# Patient Record
Sex: Male | Born: 1955 | Race: White | Hispanic: No | Marital: Married | Smoking: Never smoker
Health system: Southern US, Community
[De-identification: ages and names within clinical notes are randomized; demographics above are authoritative.]

## PROBLEM LIST (undated history)

## (undated) ENCOUNTER — Ambulatory Visit: Admission: EM | Payer: Self-pay | Source: Home / Self Care

## (undated) DIAGNOSIS — F419 Anxiety disorder, unspecified: Secondary | ICD-10-CM

## (undated) DIAGNOSIS — C61 Malignant neoplasm of prostate: Secondary | ICD-10-CM

## (undated) DIAGNOSIS — F32A Depression, unspecified: Secondary | ICD-10-CM

## (undated) DIAGNOSIS — M1990 Unspecified osteoarthritis, unspecified site: Secondary | ICD-10-CM

## (undated) DIAGNOSIS — F329 Major depressive disorder, single episode, unspecified: Secondary | ICD-10-CM

## (undated) DIAGNOSIS — C801 Malignant (primary) neoplasm, unspecified: Secondary | ICD-10-CM

## (undated) HISTORY — PX: VARICOSE VEIN SURGERY: SHX832

## (undated) HISTORY — PX: TONSILLECTOMY: SUR1361

## (undated) HISTORY — PX: RETINAL DETACHMENT SURGERY: SHX105

## (undated) HISTORY — PX: EYE SURGERY: SHX253

---

## 2009-01-05 ENCOUNTER — Ambulatory Visit: Payer: Self-pay | Admitting: Gastroenterology

## 2015-02-16 DIAGNOSIS — M19072 Primary osteoarthritis, left ankle and foot: Secondary | ICD-10-CM | POA: Insufficient documentation

## 2015-02-16 DIAGNOSIS — R7302 Impaired glucose tolerance (oral): Secondary | ICD-10-CM | POA: Insufficient documentation

## 2016-02-21 DIAGNOSIS — R972 Elevated prostate specific antigen [PSA]: Secondary | ICD-10-CM | POA: Insufficient documentation

## 2016-03-12 ENCOUNTER — Ambulatory Visit (INDEPENDENT_AMBULATORY_CARE_PROVIDER_SITE_OTHER): Payer: BLUE CROSS/BLUE SHIELD | Admitting: Urology

## 2016-03-12 ENCOUNTER — Encounter: Payer: Self-pay | Admitting: Urology

## 2016-03-12 VITALS — BP 119/68 | HR 55 | Ht 76.0 in | Wt 280.8 lb

## 2016-03-12 DIAGNOSIS — E782 Mixed hyperlipidemia: Secondary | ICD-10-CM | POA: Insufficient documentation

## 2016-03-12 DIAGNOSIS — R972 Elevated prostate specific antigen [PSA]: Secondary | ICD-10-CM | POA: Diagnosis not present

## 2016-03-12 DIAGNOSIS — F419 Anxiety disorder, unspecified: Secondary | ICD-10-CM | POA: Insufficient documentation

## 2016-03-12 DIAGNOSIS — K219 Gastro-esophageal reflux disease without esophagitis: Secondary | ICD-10-CM | POA: Insufficient documentation

## 2016-03-12 DIAGNOSIS — J309 Allergic rhinitis, unspecified: Secondary | ICD-10-CM | POA: Insufficient documentation

## 2016-03-12 DIAGNOSIS — Q100 Congenital ptosis: Secondary | ICD-10-CM | POA: Insufficient documentation

## 2016-03-12 MED ORDER — CIPROFLOXACIN HCL 500 MG PO TABS: 500.0000 mg | ORAL_TABLET | Freq: Two times a day (BID) | ORAL | 0 refills | Status: AC

## 2016-03-12 NOTE — Progress Notes (Signed)
   03/12/2016 11:30 AM   Rick Rosario 06/28/1956 [id number]  Referring provider: Ezequiel Kayser, MD [facility] [place], [place] [address]  No chief complaint on file.   HPI: Rick Rosario is a 60yo seen in consultation for elevated PSA. PSA on referral is 4.98. Per patient he has had his PSA checked previously and the results were normal.  He denies any LUTS. No issues with ED.  No family hx of prostate cancer. No issues with constipation.   PMH: No past medical history on file.  Surgical History: No past surgical history on file.  Home Medications:    Medication List    as of 03/12/2016 11:30 AM   You have not been prescribed any medications.     Allergies: Allergies not on file  Family History: No family history on file.  Social History:  has no tobacco, alcohol, and drug history on file.  ROS:                                        Physical Exam: There were no vitals taken for this visit.  Constitutional:  Alert and oriented, No acute distress. HEENT: [place] AT, moist mucus membranes.  Trachea midline, no masses. Cardiovascular: No clubbing, cyanosis, or edema. Respiratory: Normal respiratory effort, no increased work of breathing. GI: Abdomen is soft, nontender, nondistended, no abdominal masses GU: No CVA tenderness. Circumcised phallus. No masses/lesions on penis/testis/scotum. Prostate 30g right side nodule, firm.  Skin: No rashes, bruises or suspicious lesions. Lymph: No cervical or inguinal adenopathy. Neurologic: Grossly intact, no focal deficits, moving all 4 extremities. Psychiatric: Normal mood and affect.  Laboratory Data: No results found for: WBC, HGB, HCT, MCV, PLT  No results found for: CREATININE  No results found for: PSA  No results found for: TESTOSTERONE  No results found for: HGBA1C  Urinalysis No results found for: COLORURINE, APPEARANCEUR, LABSPEC, PHURINE, GLUCOSEU, HGBUR,  BILIRUBINUR, KETONESUR, PROTEINUR, UROBILINOGEN, NITRITE, LEUKOCYTESUR  Pertinent Imaging: none  Assessment & Plan:    1. Elevated PSA with Prostate nodule -schedule for prostate biopsy -rx for cipro sent to pharmacy  There are no diagnoses linked to this encounter.  No Follow-up on file.  Nicolette Bang, MD  [facility] [address], [place], [place] [address] [phone]

## 2016-04-09 ENCOUNTER — Ambulatory Visit: Payer: BLUE CROSS/BLUE SHIELD | Admitting: Urology

## 2016-04-09 ENCOUNTER — Encounter: Payer: Self-pay | Admitting: Urology

## 2016-04-09 VITALS — BP 136/79 | HR 60 | Ht 76.0 in | Wt 271.9 lb

## 2016-04-09 DIAGNOSIS — R972 Elevated prostate specific antigen [PSA]: Secondary | ICD-10-CM

## 2016-04-09 MED ORDER — CIPROFLOXACIN HCL 500 MG PO TABS: 500.0000 mg | ORAL_TABLET | Freq: Two times a day (BID) | ORAL | 0 refills | Status: AC

## 2016-04-09 MED ORDER — DIAZEPAM 10 MG PO TABS: 10.0000 mg | ORAL_TABLET | Freq: Four times a day (QID) | ORAL | 0 refills | Status: DC | PRN

## 2016-04-09 NOTE — Progress Notes (Signed)
Pt did not tolerate transrectal [place]. I prescribed valium 10mg  to take prior to the procedure and we will reschedule his prostate biopsy

## 2016-04-14 ENCOUNTER — Ambulatory Visit
Admission: EM | Admit: 2016-04-14 | Discharge: 2016-04-14 | Disposition: A | Discharge status: Home / Self Care | Payer: BLUE CROSS/BLUE SHIELD | Attending: Family Medicine | Admitting: Family Medicine

## 2016-04-14 ENCOUNTER — Ambulatory Visit (INDEPENDENT_AMBULATORY_CARE_PROVIDER_SITE_OTHER): Payer: BLUE CROSS/BLUE SHIELD

## 2016-04-14 DIAGNOSIS — S86811A Strain of other muscle(s) and tendon(s) at lower leg level, right leg, initial encounter: Secondary | ICD-10-CM

## 2016-04-14 DIAGNOSIS — S86911A Strain of unspecified muscle(s) and tendon(s) at lower leg level, right leg, initial encounter: Secondary | ICD-10-CM

## 2016-04-14 MED ORDER — HYDROCODONE-ACETAMINOPHEN 5-325 MG PO TABS: 1.0000 | ORAL_TABLET | Freq: Four times a day (QID) | ORAL | 0 refills | Status: DC | PRN

## 2016-04-14 NOTE — ED Provider Notes (Signed)
[facility]    CSN: [id number] Arrival date & time: 04/14/16  1343  First Provider Contact:  None       History   Chief Complaint Chief Complaint  Patient presents with  . Knee Pain    right    HPI Rick Rosario is a 60 y.o. male.   The history is provided by the patient.  Knee Pain  Location:  Knee Time since incident:  1 week Injury: no   Knee location:  R knee Pain details:    Quality:  Aching   Radiates to:  Does not radiate   Severity:  Moderate   Onset quality:  Gradual   Duration:  1 week   Timing:  Constant   Progression:  Worsening Chronicity:  New Dislocation: no   Foreign body present:  No foreign bodies Prior injury to area:  No Relieved by:  NSAIDs (mildly) Worsened by:  Bearing weight Associated symptoms: decreased ROM, stiffness and swelling   Associated symptoms: no back pain, no fatigue, no fever, no itching, no muscle weakness, no neck pain, no numbness and no tingling   Risk factors: obesity   Risk factors: no concern for non-accidental trauma, no frequent fractures, no known bone disorder and no recent illness     History reviewed. No pertinent past medical history.  Patient Active Problem List   Diagnosis Date Noted  . Allergic rhinitis 03/12/2016  . Anxiety 03/12/2016  . 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 GERD (gastroesophageal reflux disease) 03/12/2016  . Mixed hyperlipidemia 03/12/2016  . Elevated PSA 02/21/2016  . Impaired glucose tolerance 02/16/2015  . Primary osteoarthritis of left ankle 02/16/2015    History reviewed. No pertinent surgical history.     Home Medications    Prior to Admission medications   Medication Sig Start Date End Date Taking? Authorizing Provider  aspirin EC 81 MG tablet Take by mouth.    Historical Provider, MD  diazepam (VALIUM) 10 MG tablet Take 1 tablet (10 mg total) by mouth every 6 (six) hours as needed for anxiety. 04/09/16   Cleon Gustin, MD    HYDROcodone-acetaminophen (NORCO/VICODIN) 5-325 MG tablet Take 1-2 tablets by mouth every 6 (six) hours as needed. 04/14/16   Norval Gable, MD  naproxen sodium (ANAPROX) 220 MG tablet Take by mouth.    Historical Provider, MD  PARoxetine (PAXIL) 20 MG tablet TAKE 0.5 TABLETS (10 MG TOTAL) BY MOUTH ONCE DAILY. 02/20/16   Historical Provider, MD    Family History Family History  Problem Relation Age of Onset  . Hematuria Neg Hx   . Renal cancer Neg Hx   . Prostate cancer Neg Hx   . Sickle cell anemia Neg Hx     Social History Social History  Substance Use Topics  . Smoking status: Never Smoker  . Smokeless tobacco: Never Used  . Alcohol use No     Allergies   Penicillin g   Review of Systems Review of Systems  Constitutional: Negative for fatigue and fever.  Musculoskeletal: Positive for stiffness. Negative for back pain and neck pain.  Skin: Negative for itching.     Physical Exam Triage Vital Signs ED Triage Vitals  Enc Vitals Group     BP 04/14/16 1422 137/84     Pulse Rate 04/14/16 1422 60     Resp 04/14/16 1422 16     Temp 04/14/16 1422 97.1 F (36.2 C)     Temp Source 04/14/16 1422 Tympanic  SpO2 04/14/16 1422 98 %     Weight 04/14/16 1421 270 lb (122.5 kg)     Height 04/14/16 1421 6\' 4"  (1.93 m)     Head Circumference --      Peak Flow --      Pain Score 04/14/16 1422 7     Pain Loc --      Pain Edu? --      Excl. in [place]? --    No data found.   Updated Vital Signs BP 137/84 (BP Location: Left Arm)   Pulse 60   Temp 97.1 F (36.2 C) (Tympanic)   Resp 16   Ht 6\' 4"  (1.93 m)   Wt 270 lb (122.5 kg)   SpO2 98%   BMI 32.87 kg/m   Visual Acuity Right Eye Distance:   Left Eye Distance:   Bilateral Distance:    Right Eye Near:   Left Eye Near:    Bilateral Near:     Physical Exam  Constitutional: He appears well-developed and well-nourished. No distress.  Musculoskeletal:       Right knee: He exhibits swelling. He exhibits no effusion,  no ecchymosis, no deformity, no laceration, no erythema, normal alignment, no LCL laxity, normal patellar mobility, no bony tenderness, normal meniscus and no MCL laxity. Tenderness (diffuse antero-lateral knee ) found.  Skin: He is not diaphoretic.  Nursing note and vitals reviewed.    UC Treatments / Results  Labs (all labs ordered are listed, but only abnormal results are displayed) Labs Reviewed - No data to display  EKG  EKG Interpretation None       Radiology Dg Knee Complete 4 Views Right  Result Date: 04/14/2016 CLINICAL DATA:  Right knee pain after the right knee gave in yesterday. EXAM: RIGHT KNEE - COMPLETE 4+ VIEW COMPARISON:  None. FINDINGS: No evidence of fracture, dislocation. There is minimal suprapatellar effusion. There is mild decreased medial femoral tibial joint space. Soft tissues are unremarkable. IMPRESSION: No acute fracture or dislocation. Mild degenerative joint changes of right knee. Electronically Signed   By: Abelardo Diesel M.D.   On: 04/14/2016 15:00    Procedures Procedures (including critical care time)  Medications Ordered in UC Medications - No data to display   Initial Impression / Assessment and Plan / UC Course  I have reviewed the triage vital signs and the nursing notes.  Pertinent labs & imaging results that were available during my care of the patient were reviewed by me and considered in my medical decision making (see chart for details).  Clinical Course      Final Clinical Impressions(s) / UC Diagnoses   Final diagnoses:  Knee strain, right, initial encounter    New Prescriptions Discharge Medication List as of 04/14/2016  3:08 PM    START taking these medications   Details  HYDROcodone-acetaminophen (NORCO/VICODIN) 5-325 MG tablet Take 1-2 tablets by mouth every 6 (six) hours as needed., Starting Sun 04/14/2016, Print        1. x-ray results (negative for acute injury) and diagnosis reviewed with patient 2. rx as per  orders above; reviewed possible side effects, interactions, risks and benefits  3. Recommend supportive treatment with rest, ice, elevation, otc NSAIDS 4. Follow-up prn if symptoms worsen or don't improve   Norval Gable, MD 04/14/16 1558

## 2016-04-14 NOTE — ED Triage Notes (Signed)
Patient states that he was weed eating yesterday and his right knee just gave way on hime. Patient states that it hurts to put pressure on knee. Patient states that knee is aching.

## 2016-04-16 ENCOUNTER — Ambulatory Visit: Payer: BLUE CROSS/BLUE SHIELD

## 2016-05-07 ENCOUNTER — Other Ambulatory Visit: Payer: Self-pay | Admitting: Urology

## 2016-05-07 ENCOUNTER — Ambulatory Visit (INDEPENDENT_AMBULATORY_CARE_PROVIDER_SITE_OTHER): Payer: BLUE CROSS/BLUE SHIELD | Admitting: Urology

## 2016-05-07 ENCOUNTER — Encounter: Payer: Self-pay | Admitting: Urology

## 2016-05-07 VITALS — BP 136/83 | HR 69 | Ht 76.0 in | Wt 274.9 lb

## 2016-05-07 DIAGNOSIS — R972 Elevated prostate specific antigen [PSA]: Secondary | ICD-10-CM | POA: Diagnosis not present

## 2016-05-07 MED ORDER — GENTAMICIN SULFATE 40 MG/ML IJ SOLN
80.0000 mg | Freq: Once | INTRAMUSCULAR | Status: AC
  Administered 2016-05-07: 80 mg via INTRAMUSCULAR

## 2016-05-07 NOTE — Progress Notes (Signed)
05/07/2016 8:45 AM   Louann Liv [birth year] [id number]  Referring provider: Ezequiel Kayser, MD [facility] [place], [place] [address]  No chief complaint on file.   HPI:  1 - Elevated PSA - PSA 4.98 2017 by PCP. Had been "normal" prior. Had first attempt TRUS bx few weeks ago but did not tollerate ultarsound probe. Re-attempt 9/26 with diazepam successful, TRUS 24 mL without median lobe.   Today "Rick Rosario" is seen for re-attempt prostate biopsy. He took diazepam and ABX prior.      PMH: No past medical history on file.  Surgical History: No past surgical history on file.  Home Medications:    Medication List       Accurate as of 05/07/16  8:45 AM. Always use your most recent med list.          aspirin EC 81 MG tablet Take by mouth.   diazepam 10 MG tablet Commonly known as:  VALIUM Take 1 tablet (10 mg total) by mouth every 6 (six) hours as needed for anxiety.   HYDROcodone-acetaminophen 5-325 MG tablet Commonly known as:  NORCO/VICODIN Take 1-2 tablets by mouth every 6 (six) hours as needed.   naproxen sodium 220 MG tablet Commonly known as:  ANAPROX Take by mouth.   PARoxetine 20 MG tablet Commonly known as:  PAXIL TAKE 0.5 TABLETS (10 MG TOTAL) BY MOUTH ONCE DAILY.       Allergies:  Allergies  Allergen Reactions  . Penicillin G     Other reaction(s): Unknown    Family History: Family History  Problem Relation Age of Onset  . Hematuria Neg Hx   . Renal cancer Neg Hx   . Prostate cancer Neg Hx   . Sickle cell anemia Neg Hx     Social History:  reports that he has never smoked. He has never used smokeless tobacco. He reports that he does not drink alcohol or use drugs.   Review of Systems  Gastrointestinal (upper)  : Negative for upper GI symptoms  Gastrointestinal (lower) : Negative for lower GI symptoms  Constitutional : Negative for symptoms  Skin: Negative for skin symptoms  Eyes: Negative for  eye symptoms  Ear/Nose/Throat : Negative for Ear/Nose/Throat symptoms  Hematologic/Lymphatic: Negative for Hematologic/Lymphatic symptoms  Cardiovascular : Negative for cardiovascular symptoms  Respiratory : Negative for respiratory symptoms  Endocrine: Negative for endocrine symptoms  Musculoskeletal: Negative for musculoskeletal symptoms  Neurological: Negative for neurological symptoms  Psychologic: Negative for psychiatric symptoms    Physical Exam: There were no vitals taken for this visit.  Constitutional:  Alert and oriented, No acute distress. HEENT: [place] AT, moist mucus membranes.  Trachea midline, no masses. Cardiovascular: No clubbing, cyanosis, or edema. Respiratory: Normal respiratory effort, no increased work of breathing. GI: Abdomen is soft, nontender, nondistended, no abdominal masses GU: No CVA tenderness.  Skin: No rashes, bruises or suspicious lesions. Lymph: No cervical or inguinal adenopathy. Neurologic: Grossly intact, no focal deficits, moving all 4 extremities. Psychiatric: Normal mood and affect.   Prostate Biopsy Procedure   Informed consent was obtained after discussing risks/benefits of the procedure.  A time out was performed to ensure correct patient identity.  Pre-Procedure: - Last PSA Level: No results found for: PSA - Gentamicin given prophylactically - Levaquin 500 mg administered PO -Transrectal Ultrasound performed revealing a 24 gm prostate. Some hypoechoic areas left side. -No significant hypoechoic or median lobe noted  Procedure: - Prostate block performed using 10 cc 1% lidocaine  and biopsies taken from sextant areas, a total of 12 under ultrasound guidance.  Post-Procedure: - Patient tolerated the procedure well - He was counseled to seek immediate medical attention if experiences any severe pain, significant bleeding, or fevers - Return in one week to discuss biopsy results   Assessment & Plan:    1. Elevated PSA  - s/p biopsy as per above. Warned to contact MD for fever >101 or new urian ry retention.   2 - RTc 3 weeks for results discussion.    No Follow-up on file.  Alexis Frock, [facility] [address], [place], [place] [address] [phone]

## 2016-05-07 NOTE — Addendum Note (Signed)
Addended by: Wilson Singer on: 05/07/2016 03:25 PM   Modules accepted: Orders

## 2016-05-10 ENCOUNTER — Other Ambulatory Visit: Payer: Self-pay | Admitting: Urology

## 2016-05-10 LAB — PATHOLOGY REPORT

## 2016-05-14 ENCOUNTER — Ambulatory Visit (INDEPENDENT_AMBULATORY_CARE_PROVIDER_SITE_OTHER): Payer: BLUE CROSS/BLUE SHIELD | Admitting: Urology

## 2016-05-14 VITALS — BP 142/79 | HR 61 | Ht 76.0 in | Wt 270.0 lb

## 2016-05-14 DIAGNOSIS — C61 Malignant neoplasm of prostate: Secondary | ICD-10-CM

## 2016-05-14 NOTE — Progress Notes (Signed)
05/14/2016 4:56 PM   Louann Liv 03/27/1956 [id number]  Referring provider: Ezequiel Kayser, MD [facility] [place], [place] [address]  Chief Complaint  Patient presents with  . Prostate Cancer    biopsy results    HPI: Mr Rick Rosario is a 60yo with a hx of elevated PSA here for pathology discussion after prostate biopsy. Prostate biopsy showed Gleason 3+4=7 in 2/12 cores and Gleason 3+3=6 in 1/12 cores.     PMH: No past medical history on file.  Surgical History: No past surgical history on file.  Home Medications:    Medication List       Accurate as of 05/14/16  4:56 PM. Always use your most recent med list.          aspirin EC 81 MG tablet Take by mouth.   naproxen sodium 220 MG tablet Commonly known as:  ANAPROX Take by mouth.   PARoxetine 20 MG tablet Commonly known as:  PAXIL Take 20 mg by mouth daily.       Allergies:  Allergies  Allergen Reactions  . Penicillin G     Other reaction(s): Unknown    Family History: Family History  Problem Relation Age of Onset  . Hematuria Neg Hx   . Renal cancer Neg Hx   . Prostate cancer Neg Hx   . Sickle cell anemia Neg Hx     Social History:  reports that he has never smoked. He has never used smokeless tobacco. He reports that he does not drink alcohol or use drugs.  ROS: UROLOGY Frequent Urination?: No Hard to postpone urination?: No Burning/pain with urination?: No Get up at night to urinate?: No Leakage of urine?: No Urine stream starts and stops?: No Trouble starting stream?: No Do you have to strain to urinate?: No Blood in urine?: No Urinary tract infection?: No Sexually transmitted disease?: No Injury to kidneys or bladder?: No Painful intercourse?: No Weak stream?: No Erection problems?: No Penile pain?: No  Gastrointestinal Nausea?: No Vomiting?: No Indigestion/heartburn?: No Diarrhea?: No Constipation?: No  Constitutional Fever: No Night  sweats?: No Weight loss?: No Fatigue?: No  Skin Skin rash/lesions?: No Itching?: No  Eyes Blurred vision?: No Double vision?: No  Ears/Nose/Throat Sore throat?: No Sinus problems?: No  Hematologic/Lymphatic Swollen glands?: No Easy bruising?: No  Cardiovascular Leg swelling?: No Chest pain?: No  Respiratory Cough?: No Shortness of breath?: No  Endocrine Excessive thirst?: No  Musculoskeletal Back pain?: No Joint pain?: No  Neurological Headaches?: No Dizziness?: No  Psychologic Depression?: No Anxiety?: No  Physical Exam: BP (!) 142/79   Pulse 61   Ht 6\' 4"  (1.93 m)   Wt 122.5 kg (270 lb)   BMI 32.87 kg/m   Constitutional:  Alert and oriented, No acute distress. HEENT: [place] AT, moist mucus membranes.  Trachea midline, no masses. Cardiovascular: No clubbing, cyanosis, or edema. Respiratory: Normal respiratory effort, no increased work of breathing. GI: Abdomen is soft, nontender, nondistended, no abdominal masses GU: No CVA tenderness.  Skin: No rashes, bruises or suspicious lesions. Lymph: No cervical or inguinal adenopathy. Neurologic: Grossly intact, no focal deficits, moving all 4 extremities. Psychiatric: Normal mood and affect.  Laboratory Data: No results found for: WBC, HGB, HCT, MCV, PLT  No results found for: CREATININE  No results found for: PSA  No results found for: TESTOSTERONE  No results found for: HGBA1C  Urinalysis No results found for: COLORURINE, APPEARANCEUR, LABSPEC, PHURINE, GLUCOSEU, HGBUR, BILIRUBINUR, KETONESUR, PROTEINUR, UROBILINOGEN, NITRITE,  LEUKOCYTESUR  Pertinent Imaging: none  Assessment & Plan:   1. Prostate cancer -We discussed the natural hx of intermediate risk prostate cancer and the various treatment options including AS, IMRT, brachy, RRP, cryoablation.  Pt wants to discuss radiation therapy and will refer to Rad Onc. RTC after Rad Onc appointment to further discuss treatment   There are no  diagnoses linked to this encounter.  No Follow-up on file.  Nicolette Bang, MD  [facility] [address] [place], [place] [address] [phone]

## 2016-05-23 ENCOUNTER — Encounter: Payer: Self-pay | Admitting: Radiation Oncology

## 2016-05-23 ENCOUNTER — Ambulatory Visit
Admission: RE | Admit: 2016-05-23 | Discharge: 2016-05-23 | Disposition: A | Discharge status: Home / Self Care | Payer: BLUE CROSS/BLUE SHIELD | Source: Ambulatory Visit | Attending: Radiation Oncology | Admitting: Radiation Oncology

## 2016-05-23 VITALS — BP 144/85 | HR 54 | Temp 97.5°F | Resp 18 | Wt 286.9 lb

## 2016-05-23 DIAGNOSIS — Z7982 Long term (current) use of aspirin: Secondary | ICD-10-CM | POA: Diagnosis not present

## 2016-05-23 DIAGNOSIS — Z79899 Other long term (current) drug therapy: Secondary | ICD-10-CM | POA: Diagnosis not present

## 2016-05-23 DIAGNOSIS — C61 Malignant neoplasm of prostate: Secondary | ICD-10-CM

## 2016-05-23 NOTE — Consult Note (Signed)
NEW PATIENT EVALUATION  Name: Rick Rosario  MRN: [id number]  Date:   05/23/2016     DOB: [birth year]   This 60 y.o. 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  REFERRING PHYSICIAN: Ezequiel Kayser, MD  CHIEF COMPLAINT:  Chief Complaint  Patient presents with  . Prostate Cancer    Pt is here for initial consultation of prostate cancer.     DIAGNOSIS: The encounter diagnosis was Malignant neoplasm of prostate ([place]).   PREVIOUS INVESTIGATIONS:  Pathology reports reviewed Clinical notes reviewed [facility] nomogram reviewed  HPI: Patient is a 60 year old male who presented with a slightly elevated PSA in the 5 range performed by his PMD.He was seen by urology and on a second attempt underwent transrectal ultrasound-guided biopsy showing a 24 cc prostate with 3 of 12 biopsy specimens positive for adenocarcinoma mostly Gleason 7 (3+4). He is asymptomatic specifically denies any lower urinary tract symptoms nocturia urgency frequency. He's had no prior surgical history his overall in general excellent condition. Treatment options were reviewed with patient by urology he is now seen for radiation oncology opinion. Patient is leaning towards I-125 interstitial implant.  PLANNED TREATMENT REGIMEN: I-125 interstitial implant  PAST MEDICAL HISTORY:  has no past medical history on file.    PAST SURGICAL HISTORY: History reviewed. No pertinent surgical history.  FAMILY HISTORY: family history is not on file.  SOCIAL HISTORY:  reports that he has never smoked. He has never used smokeless tobacco. He reports that he does not drink alcohol or use drugs.  ALLERGIES: Penicillin g  MEDICATIONS:  Current Outpatient Prescriptions  Medication Sig Dispense Refill  . Garlic 123XX123 MG CAPS Take 1 capsule by mouth.    [name] GLUCOSAMINE HCL-MSM PO Take 2 tablets by mouth daily.    . Multiple  Vitamin (MULTIVITAMIN WITH MINERALS) TABS tablet Take 1 tablet by mouth daily.    [name] aspirin EC 81 MG tablet Take by mouth.    . naproxen sodium (ANAPROX) 220 MG tablet Take by mouth.    [name] PARoxetine (PAXIL) 20 MG tablet Take 20 mg by mouth daily.     No current facility-administered medications for this encounter.     ECOG PERFORMANCE STATUS:  0 - Asymptomatic  REVIEW OF SYSTEMS:  Patient denies any weight loss, fatigue, weakness, fever, chills or night sweats. Patient denies any loss of vision, blurred vision. Patient denies any ringing  of the ears or hearing loss. No irregular heartbeat. Patient denies heart murmur or history of fainting. Patient denies any chest pain or pain radiating to her upper extremities. Patient denies any shortness of breath, difficulty breathing at night, cough or hemoptysis. Patient denies any swelling in the lower legs. Patient denies any nausea vomiting, vomiting of blood, or coffee ground material in the vomitus. Patient denies any stomach pain. Patient states has had normal bowel movements no significant constipation or diarrhea. Patient denies any dysuria, hematuria or significant nocturia. Patient denies any problems walking, swelling in the joints or loss of balance. Patient denies any skin changes, loss of hair or loss of weight. Patient denies any excessive worrying or anxiety or significant depression. Patient denies any problems with insomnia. Patient denies excessive thirst, polyuria, polydipsia. Patient denies any swollen glands, patient denies easy bruising or easy bleeding. Patient denies any recent infections, allergies or URI. Patient "s visual fields have not changed significantly in recent time.  PHYSICAL EXAM: BP (!) 144/85   Pulse (!) 54   Temp 97.5 F (36.4 C)   Resp 18   Wt 286 lb 14.9 oz (130.2 kg)   BMI 34.93 kg/m  On rectal exam rectal sphincter tone is good. Prostate is smooth contracted without evidence of nodularity or mass. Sulcus  is preserved bilaterally. No discrete nodularity is identified. No other rectal abnormalities are noted. Well-developed well-nourished patient in NAD. HEENT reveals PERLA, EOMI, discs not visualized.  Oral cavity is clear. No oral mucosal lesions are identified. Neck is clear without evidence of cervical or supraclavicular adenopathy. Lungs are clear to A&P. Cardiac examination is essentially unremarkable with regular rate and rhythm without murmur rub or thrill. Abdomen is benign with no organomegaly or masses noted. Motor sensory and DTR levels are equal and symmetric in the upper and lower extremities. Cranial nerves II through XII are grossly intact. Proprioception is intact. No peripheral adenopathy or edema is identified. No motor or sensory levels are noted. Crude visual fields are within normal range.  LABORATORY DATA: Pathology reports reviewed    RADIOLOGY RESULTS: Bone scan not ordered based on low PSA which is the current recommended standard   IMPRESSION: Stage IIa adenocarcinoma the prostate in 60 year old male  PLAN: At this time I got over treatment options including watchful waiting robotic prostatectomy as well as external and I-125 interstitial implant. Risks and benefits of all procedures again were discussed in detail with the patient and his wife. Patient is leaning towards I-125 interstitial implant. I believe he would be an excellent candidate for that. I've explained to him the dual role of both for volume study as well as the implant itself. Risks and benefits of treatment including radiation safety precautions during his initial implant, fatigue, alteration of blood counts, increasing lower urinary tract symptoms and possible diarrhea and fatigue all were discussed in detail with the patient. We will arrange for his 5 studies as well as his implant over the next several days.  I would like to take this opportunity to thank you for allowing me to participate in the care of your  patient.Armstead Peaks., MD

## 2016-05-29 ENCOUNTER — Telehealth: Payer: Self-pay | Admitting: Radiology

## 2016-05-29 NOTE — Telephone Encounter (Signed)
Pt has chosen to proceed with brachytherapy. His volume study is scheduled for 06/11/16 & seed implant on 07/09/16. He is scheduled to see Dr Alyson Ingles on 06/21/16. He asks if he should keep this appt. Please advise.

## 2016-05-31 ENCOUNTER — Other Ambulatory Visit: Payer: Self-pay | Admitting: Radiology

## 2016-05-31 ENCOUNTER — Telehealth: Payer: Self-pay | Admitting: Radiology

## 2016-05-31 DIAGNOSIS — C61 Malignant neoplasm of prostate: Secondary | ICD-10-CM

## 2016-05-31 NOTE — Telephone Encounter (Signed)
Notified pt of prostate volume study scheduled 06/11/16. Pt states he has been notified by Dr Olena Leatherwood office of instructions for this procedure. Notified pt of brachytherapy scheduled 07/09/16, pre-admit testing appt on 06/25/16 @8 :15 & to call day prior to procedure for arrival time to SDS. Advised pt to hold ASA 81mg  7 days prior to procedure, clear liquid diet 48 hrs prior, npo after mn & fleets enema 2 hrs prior to procedure. Pt voices understanding. These instructions were also mailed to pt's home address.

## 2016-05-31 NOTE — Telephone Encounter (Signed)
Appt cancelled. Pt aware.

## 2016-06-11 ENCOUNTER — Ambulatory Visit
Admission: RE | Admit: 2016-06-11 | Discharge: 2016-06-11 | Disposition: A | Discharge status: Home / Self Care | Payer: BLUE CROSS/BLUE SHIELD | Source: Ambulatory Visit | Attending: Radiation Oncology | Admitting: Radiation Oncology

## 2016-06-11 ENCOUNTER — Encounter: Payer: Self-pay | Admitting: Radiation Oncology

## 2016-06-11 VITALS — BP 146/87 | HR 59 | Temp 98.4°F | Wt 284.4 lb

## 2016-06-11 DIAGNOSIS — C61 Malignant neoplasm of prostate: Secondary | ICD-10-CM

## 2016-06-11 HISTORY — PX: VOLUME STUDY: SHX6646

## 2016-06-11 SURGERY — ULTRASOUND, PROSTATE, FOR VOLUME DETERMINATION
Anesthesia: Choice

## 2016-06-11 NOTE — H&P (Signed)
NEW PATIENT EVALUATION  Name: Rick Rosario  MRN: [id number]  Date:   06/11/2016     DOB: 1955/10/08   This 60 y.o. male patient presents to the clinic for preop history and physical for I-125 interstitial implant   REFERRING PHYSICIAN: Ezequiel Kayser, MD  CHIEF COMPLAINT:  Chief Complaint  Patient presents with  . Prostate Cancer    Follow up post Volume Study    DIAGNOSIS: The encounter diagnosis was Malignant neoplasm of prostate ([place]).   PREVIOUS INVESTIGATIONS:  Pathology report reviewed Clinical notes reviewed Meds and allergies reviewed  HPI: Patient is a 60 year old male who presented with slight increase in his PSA to 5. He was seen by urology underwent transrectal ultrasound-guided biopsy showing 3 of 12 core biopsies positive for adenocarcinoma mostly Gleason 7 (3+4). Patient was asymptomatic no specific lower urinary tract symptoms or abdominal complaints. Patient opted after radiation oncology consultation for I-125 interstitial implant. He is seen today for his preop history and physical as well as volume study.  PLANNED TREATMENT REGIMEN: I-125 interstitial implant  PAST MEDICAL HISTORY:  has no past medical history on file.    PAST SURGICAL HISTORY: History reviewed. No pertinent surgical history.  FAMILY HISTORY: family history is not on file.  SOCIAL HISTORY:  reports that he has never smoked. He has never used smokeless tobacco. He reports that he does not drink alcohol or use drugs.  ALLERGIES: Penicillin g  MEDICATIONS:  Current Outpatient Prescriptions  Medication Sig Dispense Refill  . aspirin EC 81 MG tablet Take 81 mg by mouth daily.     . Garlic 123XX123 MG CAPS Take 1,000 mg by mouth daily.     [name] GLUCOSAMINE HCL-MSM PO Take 2 tablets by mouth daily.    . Multiple Vitamin (MULTIVITAMIN WITH MINERALS) TABS tablet Take 1 tablet by mouth daily.    . naproxen sodium (ANAPROX) 220 MG tablet Take 220 mg by mouth daily as needed (for pain/headache.).      [name] PARoxetine (PAXIL) 20 MG tablet Take 10 mg by mouth daily.     [name] tetrahydrozoline-zinc (VISINE-AC) 0.05-0.25 % ophthalmic solution 1-2 drops 3 (three) times daily as needed (for allergy eyes).     No current facility-administered medications for this encounter.     ECOG PERFORMANCE STATUS:  0 - Asymptomatic  REVIEW OF SYSTEMS:  Patient denies any weight loss, fatigue, weakness, fever, chills or night sweats. Patient denies any loss of vision, blurred vision. Patient denies any ringing  of the ears or hearing loss. No irregular heartbeat. Patient denies heart murmur or history of fainting. Patient denies any chest pain or pain radiating to her upper extremities. Patient denies any shortness of breath, difficulty breathing at night, cough or hemoptysis. Patient denies any swelling in the lower legs. Patient denies any nausea vomiting, vomiting of blood, or coffee ground material in the vomitus. Patient denies any stomach pain. Patient states has had normal bowel movements no significant constipation or diarrhea. Patient denies any dysuria, hematuria or significant nocturia. Patient denies any problems walking, swelling in the joints or loss of balance. Patient denies any skin changes, loss of hair or loss of weight. Patient denies any excessive worrying or anxiety or significant depression. Patient denies any problems with insomnia. Patient denies excessive thirst, polyuria, polydipsia. Patient denies any swollen glands, patient denies easy bruising or easy bleeding. Patient denies any recent infections, allergies or URI. Patient "s visual fields have not changed significantly in recent time.    PHYSICAL EXAM:  BP (!) 146/87   Pulse (!) 59   Temp 98.4 F (36.9 C)   Wt 284 lb 6.3 oz (129 kg)   BMI 34.62 kg/m  Well-developed well-nourished patient in NAD. HEENT reveals PERLA, EOMI, discs not visualized.  Oral cavity is clear. No oral mucosal lesions are identified. Neck is clear without evidence  of cervical or supraclavicular adenopathy. Lungs are clear to A&P. Cardiac examination is essentially unremarkable with regular rate and rhythm without murmur rub or thrill. Abdomen is benign with no organomegaly or masses noted. Motor sensory and DTR levels are equal and symmetric in the upper and lower extremities. Cranial nerves II through XII are grossly intact. Proprioception is intact. No peripheral adenopathy or edema is identified. No motor or sensory levels are noted. Crude visual fields are within normal range.  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  PLAN: At this time I overall risks and benefits of treatment. I've also gone over radiation safety precautions with the patient. Increased in lower urinary tract symptoms during the first several months of implant fatigue possible diarrhea possible slight alteration of blood counts all were discussed in detail with the patient and his family. They all seem to comprehend my treatment plan well. Patient had volume study today.  I would like to take this opportunity to thank you for allowing me to participate in the care of your patient.Armstead Peaks., MD

## 2016-06-11 NOTE — Progress Notes (Signed)
Radiation Oncology Follow up Note  Name: Rick Rosario   Date:   06/11/2016 MRN:  [id number] DOB: 12-15-1955    This 60 y.o. male presents to the clinic today for volume study for I-125 interstitial implant.  REFERRING PROVIDER: Ezequiel Kayser, MD  HPI: Patient is a 60 year old male presented with slight increase in his PSA to 5 was seen by urology and underwent transrectal ultrasound-guided biopsy showing 24 6 cc prostate with 3 of 12 biopsy specimens positive for adenocarcinoma mostly Gleason 7 (3+4). He is asymptomatic specifically denies any lower urinary tract symptoms nocturia urgency or frequency. Overall excellent general condition he opted for I-125 and interstitial implant is seen today for volume study and preop history and physical..  COMPLICATIONS OF TREATMENT: none  FOLLOW UP COMPLIANCE: keeps appointments   PHYSICAL EXAM:  BP (!) 146/87   Pulse (!) 59   Temp 98.4 F (36.9 C)   Wt 284 lb 6.3 oz (129 kg)   BMI 34.62 kg/m  Well-developed well-nourished patient in NAD. HEENT reveals PERLA, EOMI, discs not visualized.  Oral cavity is clear. No oral mucosal lesions are identified. Neck is clear without evidence of cervical or supraclavicular adenopathy. Lungs are clear to A&P. Cardiac examination is essentially unremarkable with regular rate and rhythm without murmur rub or thrill. Abdomen is benign with no organomegaly or masses noted. Motor sensory and DTR levels are equal and symmetric in the upper and lower extremities. Cranial nerves II through XII are grossly intact. Proprioception is intact. No peripheral adenopathy or edema is identified. No motor or sensory levels are noted. Crude visual fields are within normal range.  RADIOLOGY RESULTS: No current films for review  PLAN: Patient was taken to the cystoscopy suite in the OR. Patient was placed in the low lithotomy position. Foley catheter was placed. Trans-rectal ultrasound probe was inserted into the rectum and  prostate seminal vesicles were visualized as well as bladder base. stepping images were performed on a 5 mm increments. Images will be placed in Brachy[facility]ision treatment planning system to determine seed placement coordinates for eventual I-125 interstitial implant. Images will be reviewed with the physics and dosimetry staff for final quality approval. I personally was present for the volume study and assisted in delineation of contour volumes.  At the end of the procedure Foley catheter was removed, rectal ultrasound probe was removed. Patient tolerated his procedures extremely well with no side effects or complaints. Patient has given appointment for interstitial implant date. Consent was signed today as well as history and physical performed in preparation for his outpatient surgical implant.      Armstead Peaks., MD

## 2016-06-12 ENCOUNTER — Ambulatory Visit: Admission: RE | Admit: 2016-06-12 | Payer: BLUE CROSS/BLUE SHIELD | Source: Ambulatory Visit | Admitting: Urology

## 2016-06-13 ENCOUNTER — Ambulatory Visit: Payer: BLUE CROSS/BLUE SHIELD

## 2016-06-13 DIAGNOSIS — C61 Malignant neoplasm of prostate: Secondary | ICD-10-CM | POA: Diagnosis not present

## 2016-06-21 ENCOUNTER — Ambulatory Visit: Payer: BLUE CROSS/BLUE SHIELD

## 2016-06-25 ENCOUNTER — Encounter
Admission: RE | Admit: 2016-06-25 | Discharge: 2016-06-25 | Disposition: A | Discharge status: Home / Self Care | Payer: BLUE CROSS/BLUE SHIELD | Source: Ambulatory Visit | Attending: Urology | Admitting: Urology

## 2016-06-25 DIAGNOSIS — Z01818 Encounter for other preprocedural examination: Secondary | ICD-10-CM | POA: Insufficient documentation

## 2016-06-25 HISTORY — DX: Anxiety disorder, unspecified: F41.9

## 2016-06-25 HISTORY — DX: Major depressive disorder, single episode, unspecified: F32.9

## 2016-06-25 HISTORY — DX: Unspecified osteoarthritis, unspecified site: M19.90

## 2016-06-25 HISTORY — DX: Malignant (primary) neoplasm, unspecified: C80.1

## 2016-06-25 HISTORY — DX: Depression, unspecified: F32.A

## 2016-06-25 LAB — CBC
HEMATOCRIT: 43.9 % (ref 40.0–52.0)
HEMOGLOBIN: 15.1 g/dL (ref 13.0–18.0)
MCH: 31.5 pg (ref 26.0–34.0)
MCHC: 34.4 g/dL (ref 32.0–36.0)
MCV: 91.6 fL (ref 80.0–100.0)
PLATELETS: 221 10*3/uL (ref 150–440)
RBC: 4.79 MIL/uL (ref 4.40–5.90)
RDW: 13.5 % (ref 11.5–14.5)
WBC: 7.1 10*3/uL (ref 3.8–10.6)

## 2016-06-25 NOTE — Patient Instructions (Signed)
  Your procedure is scheduled [id number] 28, 2017 (Tuesday) Report to [facility]nd [facility] To find out your arrival time please call [phone] between 1PM - 3PM on July 08, 2016 (Monday)  Remember: Instructions that are not followed completely may result in serious medical risk, up to and including death, or upon the discretion of your surgeon and anesthesiologist your surgery may need to be rescheduled.    _x___ 1. Do not eat food or drink liquids after midnight. No gum chewing or hard candies.(CLEAR LIQUID DIET 48 HOURS PRIOR TO SURGERY)    __x__ 2. No Alcohol for 24 hours before or after surgery.   __x__3. No Smoking for 24 prior to surgery.   ____  4. Bring all medications with you on the day of surgery if instructed.    __x__ 5. Notify your doctor if there is any change in your medical condition     (cold, fever, infections).     Do not wear jewelry, make-up, hairpins, clips or nail polish.  Do not wear lotions, powders, or perfumes. You may wear deodorant.  Do not shave 48 hours prior to surgery. Men may shave face and neck.  Do not bring valuables to the hospital.    [facility] is not responsible for any belongings or valuables.               Contacts, dentures or bridgework may not be worn into surgery.  Leave your suitcase in the car. After surgery it may be brought to your room.  For patients admitted to the hospital, discharge time is determined by your treatment team.   Patients discharged the day of surgery will not be allowed to drive home.    Please read over the following fact sheets that you were given:   [facility] Preparing for Surgery and or MRSA Information   _x___ Take these medicines the morning of surgery with A SIP OF WATER:    1.   2.  3.  4.  5.  6.  _x___Fleets enema or Magnesium Citrate as directed.(Fleets enema early morning of surgery)   __ Use CHG Soap or sage wipes as directed on instruction sheet   ____ Use  inhalers on the day of surgery and bring to hospital day of surgery  ____ Stop metformin 2 days prior to surgery    ____ Take 1/2 of usual insulin dose the night before surgery and none on the morning of surgery           _x__ Stop aspirin or coumadin, or plavix (Stop Aspirin one week prior to surgery)  x__ Stop Anti-inflammatories such as Advil, Aleve, Ibuprofen, Motrin, Naproxen,          Naprosyn, Goodies powders or aspirin products. (Stop Aleve one week prior to surgery)       Ok to take Tylenol.   _x___ Stop supplements until after surgery.  (Stop Garlic and Glucosamine now)  ____ Bring C-Pap to the hospital.

## 2016-07-08 ENCOUNTER — Ambulatory Visit: Payer: BLUE CROSS/BLUE SHIELD

## 2016-07-08 MED ORDER — CIPROFLOXACIN IN D5W 400 MG/200ML IV SOLN
400.0000 mg | INTRAVENOUS | Status: AC
  Administered 2016-07-09: 400 mg via INTRAVENOUS

## 2016-07-09 ENCOUNTER — Other Ambulatory Visit: Payer: Self-pay | Admitting: Radiation Oncology

## 2016-07-09 ENCOUNTER — Ambulatory Visit
Admission: RE | Admit: 2016-07-09 | Discharge: 2016-07-09 | Disposition: A | Discharge status: Home / Self Care | Payer: BLUE CROSS/BLUE SHIELD | Source: Ambulatory Visit | Attending: Urology | Admitting: Urology

## 2016-07-09 ENCOUNTER — Encounter
Admission: RE | Disposition: A | Discharge status: Home / Self Care | Payer: Self-pay | Source: Ambulatory Visit | Attending: Urology

## 2016-07-09 ENCOUNTER — Ambulatory Visit: Payer: BLUE CROSS/BLUE SHIELD | Admitting: Anesthesiology

## 2016-07-09 ENCOUNTER — Encounter: Payer: Self-pay | Admitting: *Deleted

## 2016-07-09 ENCOUNTER — Ambulatory Visit
Admission: RE | Admit: 2016-07-09 | Discharge: 2016-07-09 | Disposition: A | Discharge status: Home / Self Care | Payer: BLUE CROSS/BLUE SHIELD | Source: Ambulatory Visit | Attending: Radiation Oncology | Admitting: Radiation Oncology

## 2016-07-09 ENCOUNTER — Ambulatory Visit: Payer: BLUE CROSS/BLUE SHIELD

## 2016-07-09 DIAGNOSIS — Z6833 Body mass index (BMI) 33.0-33.9, adult: Secondary | ICD-10-CM | POA: Diagnosis not present

## 2016-07-09 DIAGNOSIS — E669 Obesity, unspecified: Secondary | ICD-10-CM | POA: Diagnosis not present

## 2016-07-09 DIAGNOSIS — Z7982 Long term (current) use of aspirin: Secondary | ICD-10-CM | POA: Insufficient documentation

## 2016-07-09 DIAGNOSIS — Z79899 Other long term (current) drug therapy: Secondary | ICD-10-CM | POA: Insufficient documentation

## 2016-07-09 DIAGNOSIS — C61 Malignant neoplasm of prostate: Secondary | ICD-10-CM | POA: Diagnosis not present

## 2016-07-09 DIAGNOSIS — Z923 Personal history of irradiation: Secondary | ICD-10-CM

## 2016-07-09 HISTORY — PX: RADIOACTIVE SEED IMPLANT: SHX5150

## 2016-07-09 SURGERY — INSERTION, RADIATION SOURCE, PROSTATE
Anesthesia: General | Wound class: Clean Contaminated

## 2016-07-09 MED ORDER — PROPOFOL 10 MG/ML IV BOLUS
INTRAVENOUS | Status: DC | PRN
  Administered 2016-07-09: 40 mg via INTRAVENOUS

## 2016-07-09 MED ORDER — FAMOTIDINE 20 MG PO TABS
20.0000 mg | ORAL_TABLET | Freq: Once | ORAL | Status: AC
  Administered 2016-07-09: 20 mg via ORAL

## 2016-07-09 MED ORDER — MEPERIDINE HCL 25 MG/ML IJ SOLN: 6.2500 mg | INTRAMUSCULAR | Status: DC | PRN

## 2016-07-09 MED ORDER — DEXAMETHASONE SODIUM PHOSPHATE 10 MG/ML IJ SOLN
INTRAMUSCULAR | Status: DC | PRN
  Administered 2016-07-09: 4 mg via INTRAVENOUS

## 2016-07-09 MED ORDER — PROPOFOL 500 MG/50ML IV EMUL
INTRAVENOUS | Status: DC | PRN
  Administered 2016-07-09: 125 ug/kg/min via INTRAVENOUS

## 2016-07-09 MED ORDER — FENTANYL CITRATE (PF) 100 MCG/2ML IJ SOLN: 25.0000 ug | INTRAMUSCULAR | Status: DC | PRN

## 2016-07-09 MED ORDER — FENTANYL CITRATE (PF) 100 MCG/2ML IJ SOLN
INTRAMUSCULAR | Status: DC | PRN
  Administered 2016-07-09: 100 ug via INTRAVENOUS
  Administered 2016-07-09 (×2): 25 ug via INTRAVENOUS

## 2016-07-09 MED ORDER — BACITRACIN ZINC 500 UNIT/GM EX OINT
TOPICAL_OINTMENT | CUTANEOUS | Status: AC
  Filled 2016-07-09: qty 28.35

## 2016-07-09 MED ORDER — ONDANSETRON HCL 4 MG/2ML IJ SOLN
INTRAMUSCULAR | Status: DC | PRN
  Administered 2016-07-09: 4 mg via INTRAVENOUS

## 2016-07-09 MED ORDER — PROMETHAZINE HCL 25 MG/ML IJ SOLN: 6.2500 mg | INTRAMUSCULAR | Status: DC | PRN

## 2016-07-09 MED ORDER — FLEET ENEMA 7-19 GM/118ML RE ENEM
1.0000 | ENEMA | Freq: Once | RECTAL | Status: AC
  Administered 2016-07-09: 1 via RECTAL

## 2016-07-09 MED ORDER — OXYCODONE HCL 5 MG/5ML PO SOLN: 5.0000 mg | Freq: Once | ORAL | Status: DC | PRN

## 2016-07-09 MED ORDER — LACTATED RINGERS IV SOLN
INTRAVENOUS | Status: DC
  Administered 2016-07-09: 07:00:00 via INTRAVENOUS

## 2016-07-09 MED ORDER — FAMOTIDINE 20 MG PO TABS
ORAL_TABLET | ORAL | Status: AC
  Administered 2016-07-09: 20 mg via ORAL
  Filled 2016-07-09: qty 1

## 2016-07-09 MED ORDER — TAMSULOSIN HCL 0.4 MG PO CAPS: 0.4000 mg | ORAL_CAPSULE | Freq: Every day | ORAL | 0 refills | Status: DC

## 2016-07-09 MED ORDER — HYDROCODONE-ACETAMINOPHEN 5-325 MG PO TABS: 1.0000 | ORAL_TABLET | Freq: Four times a day (QID) | ORAL | 0 refills | Status: DC | PRN

## 2016-07-09 MED ORDER — LIDOCAINE HCL (CARDIAC) 20 MG/ML IV SOLN
INTRAVENOUS | Status: DC | PRN
Start: 2016-07-09 — End: 2016-07-09
  Administered 2016-07-09: 40 mg via INTRAVENOUS

## 2016-07-09 MED ORDER — OXYCODONE HCL 5 MG PO TABS: 5.0000 mg | ORAL_TABLET | Freq: Once | ORAL | Status: DC | PRN

## 2016-07-09 MED ORDER — CIPROFLOXACIN IN D5W 400 MG/200ML IV SOLN
INTRAVENOUS | Status: AC
  Filled 2016-07-09: qty 200

## 2016-07-09 MED ORDER — DOCUSATE SODIUM 100 MG PO CAPS: 100.0000 mg | ORAL_CAPSULE | Freq: Two times a day (BID) | ORAL | 0 refills | Status: DC

## 2016-07-09 MED ORDER — CIPROFLOXACIN IN D5W 400 MG/200ML IV SOLN
INTRAVENOUS | Status: AC
  Administered 2016-07-09: 400 mg via INTRAVENOUS
  Filled 2016-07-09: qty 200

## 2016-07-09 MED ORDER — CIPROFLOXACIN HCL 500 MG PO TABS: 500.0000 mg | ORAL_TABLET | Freq: Two times a day (BID) | ORAL | 0 refills | Status: DC

## 2016-07-09 SURGICAL SUPPLY — 32 items
BAG URO DRAIN 2000ML W/SPOUT (MISCELLANEOUS) ×3 IMPLANT
BLADE CLIPPER SURG (BLADE) ×3 IMPLANT
CATH FOL 2WAY LX 16X5 (CATHETERS) ×3 IMPLANT
CATH ROBINSON RED A/P 16FR (CATHETERS) IMPLANT
DRAPE INCISE 23X17 IOBAN STRL (DRAPES) ×2
DRAPE INCISE IOBAN 23X17 STRL (DRAPES) ×1 IMPLANT
DRAPE SHEET LG 3/4 BI-LAMINATE (DRAPES) ×3 IMPLANT
DRAPE TABLE BACK 80X90 (DRAPES) ×3 IMPLANT
DRAPE UNDER BUTTOCK W/FLU (DRAPES) ×3 IMPLANT
DRSG TELFA 3X8 NADH (GAUZE/BANDAGES/DRESSINGS) ×3 IMPLANT
GLOVE BIO SURGEON STRL SZ 6.5 (GLOVE) ×4 IMPLANT
GLOVE BIO SURGEON STRL SZ7 (GLOVE) ×6 IMPLANT
GLOVE BIO SURGEON STRL SZ7.5 (GLOVE) ×6 IMPLANT
GLOVE BIO SURGEONS STRL SZ 6.5 (GLOVE) ×2
GOWN STRL REUS W/ TWL LRG LVL3 (GOWN DISPOSABLE) ×2 IMPLANT
GOWN STRL REUS W/ TWL XL LVL3 (GOWN DISPOSABLE) ×1 IMPLANT
GOWN STRL REUS W/TWL LRG LVL3 (GOWN DISPOSABLE) ×4
GOWN STRL REUS W/TWL XL LVL3 (GOWN DISPOSABLE) ×2
GRADUATE 1200CC STRL 31836 (MISCELLANEOUS) ×3 IMPLANT
IV NS 1000ML (IV SOLUTION) ×2
IV NS 1000ML BAXH (IV SOLUTION) ×1 IMPLANT
KIT RM TURNOVER CYSTO AR (KITS) ×3 IMPLANT
PACK CYSTO AR (MISCELLANEOUS) ×3 IMPLANT
PAD PREP 24X41 OB/GYN DISP (PERSONAL CARE ITEMS) ×3 IMPLANT
SET CYSTO W/LG BORE CLAMP LF (SET/KITS/TRAYS/PACK) ×3 IMPLANT
SOL PREP PVP 2OZ (MISCELLANEOUS) ×3
SOLUTION PREP PVP 2OZ (MISCELLANEOUS) ×1 IMPLANT
SURGILUBE 2OZ TUBE FLIPTOP (MISCELLANEOUS) ×3 IMPLANT
SYRINGE 10CC LL (SYRINGE) ×3 IMPLANT
SYRINGE IRR TOOMEY STRL 70CC (SYRINGE) IMPLANT
WATER STERILE IRR 1000ML POUR (IV SOLUTION) ×3 IMPLANT
WATER STERILE IRR 3000ML UROMA (IV SOLUTION) ×3 IMPLANT

## 2016-07-09 NOTE — Transfer of Care (Signed)
Immediate Anesthesia Transfer of Care Note  Patient: Rick Rosario  Procedure(s) Performed: Procedure(s): RADIOACTIVE SEED IMPLANT/BRACHYTHERAPY IMPLANT (N/A)  Patient Location: PACU  Anesthesia Type:MAC  Level of Consciousness: sedated and responds to stimulation  Airway & Oxygen Therapy: Patient Spontanous Breathing and Patient connected to face mask oxygen  Post-op Assessment: Report given to RN and Post -op Vital signs reviewed and stable  Post vital signs: Reviewed and stable  Last Vitals:  Vitals:   07/09/16 0619 07/09/16 0918  BP: 133/84   Pulse: 73   Resp: 16   Temp: 36.7 C (!) 36 C    Last Pain:  Vitals:   07/09/16 0918  TempSrc: Temporal  PainSc:          Complications: No apparent anesthesia complications

## 2016-07-09 NOTE — Progress Notes (Signed)
Nuclear here to check bed with negative results

## 2016-07-09 NOTE — Op Note (Signed)
Preoperative diagnosis: Adenocarcinoma of the prostate   Postoperative diagnosis: Same   Procedure: I-125 prostate seed implantation, cystoscopy  Surgeon: [name] M.D. , Lavena Stanford, M.D.   Anesthesia: General  Drains: none  Complications: none  Indications: 60 yo M with Gleason 3+4 prostate cancer in 2/12 cores, elected brachytherapy.  Procedure: Patient was brought to operating suite and placement table in the supine position. At this time, a universal timeout protocol was performed, all team members were identified, Venodyne boots are placed, and he was administered IV Ancef in the preoperative period. He was placed in lithotomy position and prepped and draped in usual manner. Radiation oncology department placed a transrectal ultrasound probe anchoring stand/ grid and aligned with previous imaging from the volume study. Foley catheter was inserted without difficulty.  All needle passage was done with real-time transrectal ultrasound guidance in both the transverse and sagittal plains in order to achieve the desired preplanned position. A total of 18 needles were placed.  66 active seeds were implanted. The Foley catheter was removed and a rigid cystoscopy failed to show any seeds outside the prostate without evidence of trauma to the urethral, prostatic fossa, or bladder.  The bladder was drained.  A pain film image was then obtained in PACU showing excellent distrubution of the brachytherapy seeds.  Each seed was counted and counts were correct.    The patient was repositioned in the supine position, reversed from anesthesia, and taken to the PACU in stable condition.  [name], MD

## 2016-07-09 NOTE — Progress Notes (Signed)
07/09/2016  2:27 PM  Radiation Oncology Follow up Note  Name: FREY KUE:   05/29/2016 MRN:  [id number] DOB: [birth year]    This 60 y.o. male presents to for I-125 interstitial implant of the prostate   REFERRING PROVIDER: No ref. provider found  HPI: Patient is a 60 year old male presented with a PSA in the 5 range underwent transrectal ultrasound guided biopsies positive for Gleason 7 (3+4) adenocarcinoma. He underwent volume study for determining seed positioning for I-125 interstitial implant. He was seen today taken to the OR and interstitial implant was performed..  COMPLICATIONS OF TREATMENT: none  FOLLOW UP COMPLIANCE: keeps appointments   PHYSICAL EXAM:  BP 129/77   Pulse (!) 52   Temp (!) 96.9 F (36.1 C) (Temporal)   Resp 16   Ht 6\' 4"  (1.93 m)   Wt 275 lb (124.7 kg)   SpO2 99%   BMI 33.47 kg/m  Well-developed well-nourished patient in NAD. HEENT reveals PERLA, EOMI, discs not visualized.  Oral cavity is clear. No oral mucosal lesions are identified. Neck is clear without evidence of cervical or supraclavicular adenopathy. Lungs are clear to A&P. Cardiac examination is essentially unremarkable with regular rate and rhythm without murmur rub or thrill. Abdomen is benign with no organomegaly or masses noted. Motor sensory and DTR levels are equal and symmetric in the upper and lower extremities. Cranial nerves II through XII are grossly intact. Proprioception is intact. No peripheral adenopathy or edema is identified. No motor or sensory levels are noted. Crude visual fields are within normal range.  RADIOLOGY RESULTS: Seed count was performed after procedure and accurate for 66 seeds  PLAN: patient was taken to the operating room and general anesthesia was administered. Legs were immobilized in stirrups and patient was positioned in the exact same proportions as original volume study. Patient was prepped and Foley catheter was placed. Ultrasound guidance  identified the prostate and recreated the original set up as per treatment planning volume study. 27 needles were placed under ultrasound guidance with PVCs delivered to the prostate volume. After completion of procedure cystoscopy was performed by urology and no evidence of seeds in the bladder were noted. Patient tolerated the procedure extremely well. Initial plain film as doublecheck identified 80 seeds in the prostate. Patient has followup appointment in one month for CT scan for quality assurance will be performed.    Armstead Peaks., MD

## 2016-07-09 NOTE — Anesthesia Preprocedure Evaluation (Signed)
Anesthesia Evaluation  Patient identified by MRN, date of birth, ID band Patient awake    Reviewed: Allergy & Precautions, NPO status , Patient's Chart, lab work & pertinent test results  History of Anesthesia Complications Negative for: history of anesthetic complications  Airway Mallampati: II  TM Distance: >3 FB Neck ROM: Full    Dental  (+) Poor Dentition   Pulmonary neg pulmonary ROS, neg sleep apnea, neg COPD,    breath sounds clear to auscultation- rhonchi (-) wheezing      Cardiovascular Exercise Tolerance: Good (-) hypertension(-) CAD and (-) Past MI  Rhythm:Regular Rate:Normal - Systolic murmurs and - Diastolic murmurs    Neuro/Psych Anxiety Depression negative neurological ROS     GI/Hepatic Neg liver ROS, GERD  ,  Endo/Other  negative endocrine ROSneg diabetes  Renal/GU negative Renal ROS     Musculoskeletal  (+) Arthritis ,   Abdominal (+) + obese,   Peds  Hematology negative hematology ROS (+)   Anesthesia Other Findings Past Medical History: No date: Anxiety No date: Arthritis No date: Cancer [facility])     Comment: Prostate No date: Depression   Reproductive/Obstetrics                             Anesthesia Physical Anesthesia Plan  ASA: II  Anesthesia Plan: General   Post-op Pain Management:    Induction: Intravenous  Airway Management Planned: Natural Airway  Additional Equipment:   Intra-op Plan:   Post-operative Plan:   Informed Consent: I have reviewed the patients History and Physical, chart, labs and discussed the procedure including the risks, benefits and alternatives for the proposed anesthesia with the patient or authorized representative who has indicated his/her understanding and acceptance.   Dental advisory given  Plan Discussed with: CRNA and Anesthesiologist  Anesthesia Plan Comments:         Anesthesia Quick Evaluation

## 2016-07-09 NOTE — Anesthesia Procedure Notes (Signed)
Procedure Name: MAC Date/Time: 07/09/2016 8:10 AM Performed by: Kennon Holter Pre-anesthesia Checklist: Timeout performed, Patient being monitored, Suction available, Emergency Drugs available and Patient identified Patient Re-evaluated:Patient Re-evaluated prior to inductionOxygen Delivery Method: Simple face mask Preoxygenation: Pre-oxygenation with 100% oxygen Intubation Type: IV induction Airway Equipment and Method: Oral airway Placement Confirmation: positive ETCO2 and breath sounds checked- equal and bilateral

## 2016-07-09 NOTE — H&P (View-Only) (Signed)
NEW PATIENT EVALUATION  Name: Rick Rosario  MRN: [id number]  Date:   06/11/2016     DOB: 03/23/1956   This 60 y.o. male patient presents to the clinic for preop history and physical for I-125 interstitial implant   REFERRING PHYSICIAN: Ezequiel Kayser, MD  CHIEF COMPLAINT:  Chief Complaint  Patient presents with  . Prostate Cancer    Follow up post Volume Study    DIAGNOSIS: The encounter diagnosis was Malignant neoplasm of prostate ([place]).   PREVIOUS INVESTIGATIONS:  Pathology report reviewed Clinical notes reviewed Meds and allergies reviewed  HPI: Patient is a 60 year old male who presented with slight increase in his PSA to 5. He was seen by urology underwent transrectal ultrasound-guided biopsy showing 3 of 12 core biopsies positive for adenocarcinoma mostly Gleason 7 (3+4). Patient was asymptomatic no specific lower urinary tract symptoms or abdominal complaints. Patient opted after radiation oncology consultation for I-125 interstitial implant. He is seen today for his preop history and physical as well as volume study.  PLANNED TREATMENT REGIMEN: I-125 interstitial implant  PAST MEDICAL HISTORY:  has no past medical history on file.    PAST SURGICAL HISTORY: History reviewed. No pertinent surgical history.  FAMILY HISTORY: family history is not on file.  SOCIAL HISTORY:  reports that he has never smoked. He has never used smokeless tobacco. He reports that he does not drink alcohol or use drugs.  ALLERGIES: Penicillin g  MEDICATIONS:  Current Outpatient Prescriptions  Medication Sig Dispense Refill  . aspirin EC 81 MG tablet Take 81 mg by mouth daily.     . Garlic 123XX123 MG CAPS Take 1,000 mg by mouth daily.     [name] GLUCOSAMINE HCL-MSM PO Take 2 tablets by mouth daily.    . Multiple Vitamin (MULTIVITAMIN WITH MINERALS) TABS tablet Take 1 tablet by mouth daily.    . naproxen sodium (ANAPROX) 220 MG tablet Take 220 mg by mouth daily as needed (for pain/headache.).      [name] PARoxetine (PAXIL) 20 MG tablet Take 10 mg by mouth daily.     [name] tetrahydrozoline-zinc (VISINE-AC) 0.05-0.25 % ophthalmic solution 1-2 drops 3 (three) times daily as needed (for allergy eyes).     No current facility-administered medications for this encounter.     ECOG PERFORMANCE STATUS:  0 - Asymptomatic  REVIEW OF SYSTEMS:  Patient denies any weight loss, fatigue, weakness, fever, chills or night sweats. Patient denies any loss of vision, blurred vision. Patient denies any ringing  of the ears or hearing loss. No irregular heartbeat. Patient denies heart murmur or history of fainting. Patient denies any chest pain or pain radiating to her upper extremities. Patient denies any shortness of breath, difficulty breathing at night, cough or hemoptysis. Patient denies any swelling in the lower legs. Patient denies any nausea vomiting, vomiting of blood, or coffee ground material in the vomitus. Patient denies any stomach pain. Patient states has had normal bowel movements no significant constipation or diarrhea. Patient denies any dysuria, hematuria or significant nocturia. Patient denies any problems walking, swelling in the joints or loss of balance. Patient denies any skin changes, loss of hair or loss of weight. Patient denies any excessive worrying or anxiety or significant depression. Patient denies any problems with insomnia. Patient denies excessive thirst, polyuria, polydipsia. Patient denies any swollen glands, patient denies easy bruising or easy bleeding. Patient denies any recent infections, allergies or URI. Patient "s visual fields have not changed significantly in recent time.    PHYSICAL EXAM:  BP (!) 146/87   Pulse (!) 59   Temp 98.4 F (36.9 C)   Wt 284 lb 6.3 oz (129 kg)   BMI 34.62 kg/m  Well-developed well-nourished patient in NAD. HEENT reveals PERLA, EOMI, discs not visualized.  Oral cavity is clear. No oral mucosal lesions are identified. Neck is clear without evidence  of cervical or supraclavicular adenopathy. Lungs are clear to A&P. Cardiac examination is essentially unremarkable with regular rate and rhythm without murmur rub or thrill. Abdomen is benign with no organomegaly or masses noted. Motor sensory and DTR levels are equal and symmetric in the upper and lower extremities. Cranial nerves II through XII are grossly intact. Proprioception is intact. No peripheral adenopathy or edema is identified. No motor or sensory levels are noted. Crude visual fields are within normal range.  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  PLAN: At this time I overall risks and benefits of treatment. I've also gone over radiation safety precautions with the patient. Increased in lower urinary tract symptoms during the first several months of implant fatigue possible diarrhea possible slight alteration of blood counts all were discussed in detail with the patient and his family. They all seem to comprehend my treatment plan well. Patient had volume study today.  I would like to take this opportunity to thank you for allowing me to participate in the care of your patient.Armstead Peaks., MD

## 2016-07-09 NOTE — Discharge Instructions (Signed)
Brachytherapy for Prostate Cancer, Care After °Refer to this sheet in the next few weeks. These instructions provide you with information on caring for yourself after your procedure. Your health care provider may also give you more specific instructions. Your treatment has been planned according to current medical practices, but problems sometimes occur. Call your health care provider if you have any problems or questions after your procedure. °What can I expect after the procedure? °The area behind the scrotum will probably be tender and bruised. For a short period of time you may have: °· Difficulty passing urine. You may need a catheter for a few days to a month. °· Blood in the urine or semen. °· A feeling of constipation because of prostate swelling. °· Frequent feeling of an urgent need to urinate. ° °For a long period of time you may have: °· Inflammation of the rectum. This happens in about 2% of people who have the procedure. °· Erection problems. These vary with age and occur in about 15-40% of men. °· Difficulty urinating. This is caused by scarring in the urethra. °· Diarrhea. ° °Follow these instructions at home: °· Take medicines only as directed by your health care provider. °· You will probably have a catheter in your bladder for several days. You will have blood in the urine bag and should drink a lot of fluids to keep it a light red color. °· Keep all follow-up visits as directed by your health care provider. If you have a catheter, it will be removed during one of these visits. °· Try not to sit directly on the area behind the scrotum. A soft cushion can decrease the discomfort. Ice packs may also be helpful for the discomfort. Do not put ice directly on the skin. °· Shower and wash the area behind the scrotum gently. Do not sit in a tub. °· If you have had the brachytherapy that uses the seeds, limit your close contact with children and pregnant women for 2 months because of the radiation still  in the prostate. After that period of time, the levels drop off quickly. °Get help right away if: °· You have a fever. °· You have chills. °· You have shortness of breath. °· You have chest pain. °· You have thick blood, like tomato juice, in the urine bag. °· Your catheter is blocked so urine cannot get into the bag. Your bladder area or lower abdomen may be swollen. °· There is excessive bleeding from your rectum. It is normal to have a little blood mixed with your stool. °· There is severe discomfort in the treated area that does not go away with pain medicine. °· You have abdominal discomfort. °· You have severe nausea or vomiting. °· You develop any new or unusual symptoms. °This information is not intended to replace advice given to you by your health care provider. Make sure you discuss any questions you have with your health care provider. °Document Released: 08/31/2010 Document Revised: 01/10/2016 Document Reviewed: 01/19/2013 °Elsevier Interactive Patient Education © 2017 [employer] ° °

## 2016-07-09 NOTE — Interval H&P Note (Signed)
History and Physical Interval Note:  07/09/2016 7:24 AM  Rick Rosario  has presented today for surgery, with the diagnosis of PROSTATE CANCER  The various methods of treatment have been discussed with the patient and family. After consideration of risks, benefits and other options for treatment, the patient has consented to  Procedure(s): RADIOACTIVE SEED IMPLANT/BRACHYTHERAPY IMPLANT (N/A) as a surgical intervention .  The patient's history has been reviewed, patient examined, no change in status, stable for surgery.  I have reviewed the patient's chart and labs.  Questions were answered to the patient's satisfaction.     Rick Rosario

## 2016-07-09 NOTE — Anesthesia Postprocedure Evaluation (Signed)
Anesthesia Post Note  Patient: Rick Rosario  Procedure(s) Performed: Procedure(s) (LRB): RADIOACTIVE SEED IMPLANT/BRACHYTHERAPY IMPLANT (N/A)  Patient location during evaluation: PACU Anesthesia Type: General Level of consciousness: awake and alert Pain management: pain level controlled Vital Signs Assessment: post-procedure vital signs reviewed and stable Respiratory status: spontaneous breathing, nonlabored ventilation and respiratory function stable Cardiovascular status: blood pressure returned to baseline and stable Postop Assessment: no signs of nausea or vomiting Anesthetic complications: no    Last Vitals:  Vitals:   07/09/16 1010 07/09/16 1019  BP: 139/71 129/76  Pulse: (!) 50 (!) 49  Resp: 12 16  Temp:  (!) 36.1 C    Last Pain:  Vitals:   07/09/16 1019  TempSrc: Temporal  PainSc:                  Annalese Stiner

## 2016-07-09 NOTE — Progress Notes (Signed)
Voided 300cc blood tinged urine 

## 2016-08-05 ENCOUNTER — Other Ambulatory Visit: Payer: Self-pay | Admitting: Urology

## 2016-08-07 ENCOUNTER — Ambulatory Visit
Admission: RE | Admit: 2016-08-07 | Discharge: 2016-08-07 | Disposition: A | Discharge status: Home / Self Care | Payer: BLUE CROSS/BLUE SHIELD | Source: Ambulatory Visit | Attending: Radiation Oncology | Admitting: Radiation Oncology

## 2016-08-07 ENCOUNTER — Encounter: Payer: Self-pay | Admitting: Radiation Oncology

## 2016-08-07 ENCOUNTER — Other Ambulatory Visit: Payer: Self-pay | Admitting: *Deleted

## 2016-08-07 VITALS — BP 153/88 | HR 69 | Wt 287.5 lb

## 2016-08-07 DIAGNOSIS — Z923 Personal history of irradiation: Secondary | ICD-10-CM | POA: Diagnosis not present

## 2016-08-07 DIAGNOSIS — C61 Malignant neoplasm of prostate: Secondary | ICD-10-CM

## 2016-08-07 HISTORY — DX: Malignant neoplasm of prostate: C61

## 2016-08-07 NOTE — Progress Notes (Signed)
Radiation Oncology Follow up Note  Name: Rick Rosario   Date:   08/07/2016 MRN:  [id number] DOB: 1955/09/10    This 60 y.o. male presents to the clinic today for one-month follow-up status post I-125 interstitial implant for prostate cancer  REFERRING PROVIDER: Ezequiel Kayser, MD  HPI: Patient is a 60 year old male now out 1 month having completed I-125 interstitial implant for adenocarcinoma the prostate Gleason 7 (3+4) presenting with a PSA in the 5 range. Seen today in routine follow-up he is doing well. Specifically denies diarrhea dysuria or any other GU G I complaints..  COMPLICATIONS OF TREATMENT: none  FOLLOW UP COMPLIANCE: keeps appointments   PHYSICAL EXAM:  BP (!) 153/88   Pulse 69   Wt 287 lb 7.7 oz (130.4 kg)   BMI 34.99 kg/m  On rectal exam rectal sphincter tone is good. Prostate is smooth contracted without evidence of nodularity or mass. Sulcus is preserved bilaterally. No discrete nodularity is identified. No other rectal abnormalities are noted. Well-developed well-nourished patient in NAD. HEENT reveals PERLA, EOMI, discs not visualized.  Oral cavity is clear. No oral mucosal lesions are identified. Neck is clear without evidence of cervical or supraclavicular adenopathy. Lungs are clear to A&P. Cardiac examination is essentially unremarkable with regular rate and rhythm without murmur rub or thrill. Abdomen is benign with no organomegaly or masses noted. Motor sensory and DTR levels are equal and symmetric in the upper and lower extremities. Cranial nerves II through XII are grossly intact. Proprioception is intact. No peripheral adenopathy or edema is identified. No motor or sensory levels are noted. Crude visual fields are within normal range.  RADIOLOGY RESULTS: CT scan was performed today for quality assurance showing excellent positioning of his I-125 seeds  PLAN: Present time patient is doing well recovering nicely from his interstitial implant. I've explained  to him our protocol for tracking his PSA and have asked to see him back in 3-4 months at which time PSA will be ordered if not already performed. Patient knows to call with any concerns.  I would like to take this opportunity to thank you for allowing me to participate in the care of your patient.Armstead Peaks., MD

## 2016-08-08 DIAGNOSIS — C61 Malignant neoplasm of prostate: Secondary | ICD-10-CM | POA: Diagnosis not present

## 2016-10-06 ENCOUNTER — Other Ambulatory Visit: Payer: Self-pay | Admitting: Urology

## 2016-10-07 ENCOUNTER — Telehealth: Payer: Self-pay | Admitting: Urology

## 2016-10-07 NOTE — Telephone Encounter (Signed)
Patient is requesting a refill on his tamsuolosin.  I do not see where he has had a recent PSA or office visit.  He needs both before a refill can be given.

## 2016-10-10 NOTE — Telephone Encounter (Signed)
Patient said he did not request this and did not want it.  Sharyn Lull

## 2016-11-28 ENCOUNTER — Inpatient Hospital Stay: Payer: BLUE CROSS/BLUE SHIELD | Attending: Radiation Oncology

## 2016-11-28 DIAGNOSIS — C61 Malignant neoplasm of prostate: Secondary | ICD-10-CM | POA: Diagnosis not present

## 2016-11-28 LAB — PSA: PSA: 0.89 ng/mL (ref 0.00–4.00)

## 2016-12-05 ENCOUNTER — Encounter: Payer: Self-pay | Admitting: Radiation Oncology

## 2016-12-05 ENCOUNTER — Ambulatory Visit
Admission: RE | Admit: 2016-12-05 | Discharge: 2016-12-05 | Disposition: A | Discharge status: Home / Self Care | Payer: BLUE CROSS/BLUE SHIELD | Source: Ambulatory Visit | Attending: Radiation Oncology | Admitting: Radiation Oncology

## 2016-12-05 VITALS — BP 130/80 | HR 57 | Temp 97.1°F | Wt 277.7 lb

## 2016-12-05 DIAGNOSIS — Z923 Personal history of irradiation: Secondary | ICD-10-CM | POA: Diagnosis not present

## 2016-12-05 DIAGNOSIS — C61 Malignant neoplasm of prostate: Secondary | ICD-10-CM | POA: Diagnosis present

## 2016-12-05 NOTE — Progress Notes (Signed)
Radiation Oncology Follow up Note  Name: Rick Rosario   Date:   12/05/2016 MRN:  [id number] DOB: 1955-12-05    This 61 y.o. male presents to the clinic today for 5 month follow-up status post I-125 interstitial implant for prostate cancer.  REFERRING PROVIDER: Ezequiel Kayser, MD  HPI: Patient is a 61 year old male now out 5 months having completed I-125 interstitial implant for Gleason 7 (3+4) adenocarcinoma the prostate presenting with a PSA of 5. Seen today in routine follow-up he is doing well. He specifically denies diarrhea dysuria or any other GI/GU complaints. His most recent PSA performed this month was 0.89.[name]  COMPLICATIONS OF TREATMENT: none  FOLLOW UP COMPLIANCE: keeps appointments   PHYSICAL EXAM:  BP 130/80   Pulse (!) 57   Temp 97.1 F (36.2 C)   Wt 277 lb 10.7 oz (126 kg)   BMI 33.80 kg/m  On rectal exam rectal sphincter tone is good. Prostate is smooth contracted without evidence of nodularity or mass. Sulcus is preserved bilaterally. No discrete nodularity is identified. No other rectal abnormalities are noted. Well-developed well-nourished patient in NAD. HEENT reveals PERLA, EOMI, discs not visualized.  Oral cavity is clear. No oral mucosal lesions are identified. Neck is clear without evidence of cervical or supraclavicular adenopathy. Lungs are clear to A&P. Cardiac examination is essentially unremarkable with regular rate and rhythm without murmur rub or thrill. Abdomen is benign with no organomegaly or masses noted. Motor sensory and DTR levels are equal and symmetric in the upper and lower extremities. Cranial nerves II through XII are grossly intact. Proprioception is intact. No peripheral adenopathy or edema is identified. No motor or sensory levels are noted. Crude visual fields are within normal range.  RADIOLOGY RESULTS: No current films for review  PLAN: Present time patient is doing well under good biochemical control of his prostate cancer. I've asked  to see him back in 6 months for follow-up and will obtain a PSA at that time. Patient knows to call with any concerns.  Macro thanks    Armstead Peaks., MD

## 2017-06-04 ENCOUNTER — Inpatient Hospital Stay: Payer: BLUE CROSS/BLUE SHIELD | Attending: Radiation Oncology | Admitting: *Deleted

## 2017-06-04 DIAGNOSIS — Z79899 Other long term (current) drug therapy: Secondary | ICD-10-CM | POA: Diagnosis not present

## 2017-06-04 DIAGNOSIS — I2699 Other pulmonary embolism without acute cor pulmonale: Secondary | ICD-10-CM | POA: Diagnosis not present

## 2017-06-04 DIAGNOSIS — C61 Malignant neoplasm of prostate: Secondary | ICD-10-CM | POA: Diagnosis not present

## 2017-06-04 DIAGNOSIS — F329 Major depressive disorder, single episode, unspecified: Secondary | ICD-10-CM | POA: Insufficient documentation

## 2017-06-04 DIAGNOSIS — M25561 Pain in right knee: Secondary | ICD-10-CM | POA: Diagnosis not present

## 2017-06-04 DIAGNOSIS — Z923 Personal history of irradiation: Secondary | ICD-10-CM | POA: Diagnosis not present

## 2017-06-04 DIAGNOSIS — Z7982 Long term (current) use of aspirin: Secondary | ICD-10-CM | POA: Insufficient documentation

## 2017-06-04 DIAGNOSIS — K869 Disease of pancreas, unspecified: Secondary | ICD-10-CM | POA: Insufficient documentation

## 2017-06-04 DIAGNOSIS — F419 Anxiety disorder, unspecified: Secondary | ICD-10-CM | POA: Diagnosis not present

## 2017-06-04 DIAGNOSIS — Z7901 Long term (current) use of anticoagulants: Secondary | ICD-10-CM | POA: Diagnosis not present

## 2017-06-04 LAB — PSA: PROSTATIC SPECIFIC ANTIGEN: 0.71 ng/mL (ref 0.00–4.00)

## 2017-06-08 ENCOUNTER — Emergency Department: Payer: BLUE CROSS/BLUE SHIELD

## 2017-06-08 ENCOUNTER — Other Ambulatory Visit: Payer: Self-pay

## 2017-06-08 ENCOUNTER — Encounter: Payer: Self-pay | Admitting: Radiology

## 2017-06-08 ENCOUNTER — Emergency Department
Admission: EM | Admit: 2017-06-08 | Discharge: 2017-06-09 | Disposition: A | Discharge status: Home / Self Care | Payer: BLUE CROSS/BLUE SHIELD | Attending: Emergency Medicine | Admitting: Emergency Medicine

## 2017-06-08 DIAGNOSIS — R0789 Other chest pain: Secondary | ICD-10-CM

## 2017-06-08 DIAGNOSIS — Z8546 Personal history of malignant neoplasm of prostate: Secondary | ICD-10-CM | POA: Insufficient documentation

## 2017-06-08 DIAGNOSIS — Z79899 Other long term (current) drug therapy: Secondary | ICD-10-CM | POA: Diagnosis not present

## 2017-06-08 DIAGNOSIS — Z7982 Long term (current) use of aspirin: Secondary | ICD-10-CM | POA: Diagnosis not present

## 2017-06-08 DIAGNOSIS — I2699 Other pulmonary embolism without acute cor pulmonale: Secondary | ICD-10-CM | POA: Insufficient documentation

## 2017-06-08 LAB — CBC
HEMATOCRIT: 45.6 % (ref 40.0–52.0)
HEMOGLOBIN: 15.5 g/dL (ref 13.0–18.0)
MCH: 31.4 pg (ref 26.0–34.0)
MCHC: 34.1 g/dL (ref 32.0–36.0)
MCV: 92.1 fL (ref 80.0–100.0)
Platelets: 210 10*3/uL (ref 150–440)
RBC: 4.95 MIL/uL (ref 4.40–5.90)
RDW: 13.3 % (ref 11.5–14.5)
WBC: 8.6 10*3/uL (ref 3.8–10.6)

## 2017-06-08 LAB — BASIC METABOLIC PANEL
ANION GAP: 9 (ref 5–15)
BUN: 22 mg/dL — ABNORMAL HIGH (ref 6–20)
CHLORIDE: 104 mmol/L (ref 101–111)
CO2: 24 mmol/L (ref 22–32)
Calcium: 9.1 mg/dL (ref 8.9–10.3)
Creatinine, Ser: 1.64 mg/dL — ABNORMAL HIGH (ref 0.61–1.24)
GFR calc non Af Amer: 44 mL/min — ABNORMAL LOW (ref >60)
GFR, EST AFRICAN AMERICAN: 51 mL/min — AB (ref >60)
Glucose, Bld: 109 mg/dL — ABNORMAL HIGH (ref 65–99)
POTASSIUM: 3.8 mmol/L (ref 3.5–5.1)
SODIUM: 137 mmol/L (ref 135–145)

## 2017-06-08 LAB — FIBRIN DERIVATIVES D-DIMER (ARMC ONLY): FIBRIN DERIVATIVES D-DIMER (ARMC): 998.35 ng{FEU}/mL — AB (ref 0.00–499.00)

## 2017-06-08 LAB — TROPONIN I

## 2017-06-08 MED ORDER — IBUPROFEN 400 MG PO TABS
400.0000 mg | ORAL_TABLET | Freq: Once | ORAL | Status: AC
  Administered 2017-06-08: 400 mg via ORAL
  Filled 2017-06-08: qty 1

## 2017-06-08 MED ORDER — IOPAMIDOL (ISOVUE-370) INJECTION 76%
60.0000 mL | Freq: Once | INTRAVENOUS | Status: AC | PRN
Start: 2017-06-08 — End: 2017-06-08
  Administered 2017-06-08: 60 mL via INTRAVENOUS

## 2017-06-08 NOTE — ED Notes (Signed)
Pt came in with cp and sob after exertion (was playing with his dogs). Relieved now. Blood and ekg was obtained in triage.

## 2017-06-08 NOTE — ED Notes (Signed)
Patient transported to X-ray 

## 2017-06-08 NOTE — ED Triage Notes (Signed)
Patient reports left sided chest pain for approximately 1 hour.  Reports radiates up toward left shoulder.  Patient describes pain as sharpe.  Patient tearful and hyperventilating in triage.

## 2017-06-08 NOTE — ED Provider Notes (Signed)
[facility] Emergency Department Provider Note    ____________________________________________   I have reviewed the triage vital signs and the nursing notes.   HISTORY  Chief Complaint Chest Pain   History limited by: Not Limited   HPI Rick Rosario is a 61 y.o. male who presents to the emergency department today because of chest pain.   LOCATION:central/lower chest DURATION:4 hours TIMING: started suddenly SEVERITY: severe initially, gradually improving QUALITY: sharp CONTEXT: patient states he was playing with his dogs when it started. No recent trauma. MODIFYING FACTORS: none ASSOCIATED SYMPTOMS: some shortness of breath.  Per medical record review patient has a history of anxiety.  Past Medical History:  Diagnosis Date  . Anxiety   . Arthritis   . Cancer [facility])    Prostate  . Depression   . Prostate cancer [facility])     Patient Active Problem List   Diagnosis Date Noted  . Prostate cancer ([place]) 05/14/2016  . Allergic rhinitis 03/12/2016  . Anxiety 03/12/2016  . 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 GERD (gastroesophageal reflux disease) 03/12/2016  . Mixed hyperlipidemia 03/12/2016  . Elevated PSA 02/21/2016  . Impaired glucose tolerance 02/16/2015  . Primary osteoarthritis of left ankle 02/16/2015    Past Surgical History:  Procedure Laterality Date  . EYE SURGERY Left    Cataract Extraction with IOL  . RADIOACTIVE SEED IMPLANT N/A 07/09/2016   Procedure: RADIOACTIVE SEED IMPLANT/BRACHYTHERAPY IMPLANT;  Surgeon: [name], MD;  Location: [facility] ORS;  Service: Urology;  Laterality: N/A;  . RETINAL DETACHMENT SURGERY Left    [place]     Age 20 years old  . VARICOSE VEIN SURGERY Left   . VOLUME STUDY  06/11/2016   [facility]    Prior to Admission medications   Medication Sig Start Date End Date Taking? Authorizing Provider  aspirin EC 81 MG tablet Take 81 mg by mouth daily.    Yes [provider]  Garlic 5643 MG CAPS Take 1,000 mg by mouth daily.    Yes [provider]  Multiple Vitamin (MULTIVITAMIN WITH MINERALS) TABS tablet Take 1 tablet by mouth daily.   Yes [provider]  PARoxetine (PAXIL) 20 MG tablet Take 10 mg by mouth daily.    Yes [provider]  tetrahydrozoline-zinc (VISINE-AC) 0.05-0.25 % ophthalmic solution 1-2 drops 3 (three) times daily as needed (for allergy eyes).   Yes [provider]  naproxen sodium (ANAPROX) 220 MG tablet Take 220 mg by mouth daily as needed (for pain/headache.).     [provider]    Allergies Penicillin g  Family History  Problem Relation Age of Onset  . Heart attack Father   . Hematuria Neg Hx   . Renal cancer Neg Hx   . Prostate cancer Neg Hx   . Sickle cell anemia Neg Hx     Social History Social History  Substance Use Topics  . Smoking status: Never Smoker  . Smokeless tobacco: Never Used  . Alcohol use No    Review of Systems Constitutional: No fever/chills Eyes: No visual changes. ENT: No sore throat. Cardiovascular: Positive chest pain. Respiratory: Positive shortness of breath. Gastrointestinal: No abdominal pain.  No nausea, no vomiting.  No diarrhea.   Genitourinary: Negative for dysuria. Musculoskeletal: Negative for back pain. Skin: Negative for rash. Neurological: Negative for headaches, focal weakness or numbness.  ____________________________________________   PHYSICAL EXAM:  VITAL SIGNS: ED Triage Vitals  Enc Vitals Group  BP 06/08/17 2002 131/79     Pulse Rate 06/08/17 2002 78     Resp 06/08/17 2002 19     Temp 06/08/17 2002 98 F (36.7 C)     Temp src --      SpO2 06/08/17 2002 96 %     Weight 06/08/17 1953 275 lb (124.7 kg)     Height 06/08/17 1953 6\' 3"  (1.905 m)     Head Circumference --      Peak Flow --      Pain Score 06/08/17 2314 5   Constitutional: Alert and oriented. Well appearing and in no distress. Eyes:  Conjunctivae are normal.  ENT   Head: Normocephalic and atraumatic.   Nose: No congestion/rhinnorhea.   Mouth/Throat: Mucous membranes are moist.   Neck: No stridor. Hematological/Lymphatic/Immunilogical: No cervical lymphadenopathy. Cardiovascular: Normal rate, regular rhythm.  No murmurs, rubs, or gallops.  Respiratory: Normal respiratory effort without tachypnea nor retractions. Breath sounds are clear and equal bilaterally. No wheezes/rales/rhonchi. Gastrointestinal: Soft and non tender. No rebound. No guarding.  Genitourinary: Deferred Musculoskeletal: Normal range of motion in all extremities. No lower extremity edema. Neurologic:  Normal speech and language. No gross focal neurologic deficits are appreciated.  Skin:  Skin is warm, dry and intact. No rash noted. Psychiatric: Mood and affect are normal. Speech and behavior are normal. Patient exhibits appropriate insight and judgment.  ____________________________________________    LABS (pertinent positives/negatives)  D-dimer >500 Trop <0.03 CBC wnl BMP cr 1.64  ____________________________________________   EKG  I, Nance Pear, attending physician, personally viewed and interpreted this EKG  EKG Time: 1952 Rate: 76 Rhythm: normal sinus rhythm Axis: normal Intervals: qtc 436 QRS: nonspecific IVCD ST changes: no st elevation Impression: abnormal ekg   ____________________________________________    RADIOLOGY  CXR No acute disease  ____________________________________________   PROCEDURES  Procedures  ____________________________________________   INITIAL IMPRESSION / ASSESSMENT AND PLAN / ED COURSE  Pertinent labs & imaging results that were available during my care of the patient were reviewed by me and considered in my medical decision making (see chart for details).  Differential diagnosis includes, but is not limited to, ACS, aortic dissection, pulmonary embolism, cardiac  tamponade, pneumothorax, pneumonia, pericarditis/myocarditis, GI-related causes including esophagitis/gastritis, and musculoskeletal chest wall pain.   Patient's workup was concerning for an elevated d-dimer.  Chest x-ray EKG and initial troponin all within normal limits.  CT angio was ordered to evaluate for PE.  If CT scan and second troponin negative I do think patient would be appropriate for discharge.   ____________________________________________   FINAL CLINICAL IMPRESSION(S) / ED DIAGNOSES  Chest pain.  Note: This dictation was prepared with Dragon dictation. Any transcriptional errors that result from this process are unintentional     Nance Pear, MD 06/09/17 [phone]

## 2017-06-09 MED ORDER — RIVAROXABAN (XARELTO) VTE STARTER PACK (15 & 20 MG): ORAL_TABLET | ORAL | 0 refills | Status: DC

## 2017-06-09 MED ORDER — RIVAROXABAN 15 MG PO TABS
15.0000 mg | ORAL_TABLET | Freq: Once | ORAL | Status: AC
Start: 2017-06-09 — End: 2017-06-09
  Administered 2017-06-09: 15 mg via ORAL
  Filled 2017-06-09: qty 1

## 2017-06-09 NOTE — ED Provider Notes (Signed)
-----------------------------------------   1:05 AM on 06/09/2017 -----------------------------------------   Blood pressure (!) 134/93, pulse (!) 56, temperature 98 F (36.7 C), resp. rate 15, height 6\' 3"  (1.905 m), weight 124.7 kg (275 lb), SpO2 98 %.  Assuming care from Dr. Archie Balboa.  In short, Rick Rosario is a 61 y.o. male with a chief complaint of Chest Pain .  Refer to the original H&P for additional details.  The current plan of care is to follow up the results of the CT scan and disposition the patient.     The patient had a CT scan which showed a right middle and right lower lobe segmental and subsegmental pulmonary artery emboli. No definite CT of the right heart straining. 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  I discussed with the patient that he needs to follow-up with hematology. He did receive a dose of Xarelto in the emergency department. the patient's vital signs are unremarkable. He is not tachycardic he is not hypoxic and he is not tachypneic. I feel that the patient is appropriate for discharge on oral anticoagulation. He will be discharged to home.   Loney Hering, MD 06/09/17 [phone]

## 2017-06-09 NOTE — Discharge Instructions (Signed)
Please follow up with hematology. Should you develop any worsening pain, shortness of breath, fast heart rate, dizziness lightheadedness or any other concerns please return to the emergency department immediately for further evaluation and treatment.

## 2017-06-11 ENCOUNTER — Ambulatory Visit
Admission: RE | Admit: 2017-06-11 | Discharge: 2017-06-11 | Disposition: A | Discharge status: Home / Self Care | Payer: BLUE CROSS/BLUE SHIELD | Source: Ambulatory Visit | Attending: Radiation Oncology | Admitting: Radiation Oncology

## 2017-06-11 ENCOUNTER — Inpatient Hospital Stay: Payer: BLUE CROSS/BLUE SHIELD

## 2017-06-11 ENCOUNTER — Inpatient Hospital Stay (HOSPITAL_BASED_OUTPATIENT_CLINIC_OR_DEPARTMENT_OTHER): Payer: BLUE CROSS/BLUE SHIELD | Admitting: Hematology and Oncology

## 2017-06-11 ENCOUNTER — Encounter: Payer: Self-pay | Admitting: Hematology and Oncology

## 2017-06-11 ENCOUNTER — Encounter: Payer: Self-pay | Admitting: Radiation Oncology

## 2017-06-11 VITALS — BP 125/82 | HR 74 | Temp 99.0°F | Resp 18 | Wt 271.8 lb

## 2017-06-11 VITALS — BP 143/86 | HR 84 | Temp 98.9°F | Resp 22 | Wt 271.2 lb

## 2017-06-11 DIAGNOSIS — I2699 Other pulmonary embolism without acute cor pulmonale: Secondary | ICD-10-CM | POA: Diagnosis not present

## 2017-06-11 DIAGNOSIS — Z7901 Long term (current) use of anticoagulants: Secondary | ICD-10-CM | POA: Diagnosis not present

## 2017-06-11 DIAGNOSIS — C61 Malignant neoplasm of prostate: Secondary | ICD-10-CM

## 2017-06-11 DIAGNOSIS — K869 Disease of pancreas, unspecified: Secondary | ICD-10-CM

## 2017-06-11 DIAGNOSIS — Z79899 Other long term (current) drug therapy: Secondary | ICD-10-CM | POA: Diagnosis not present

## 2017-06-11 DIAGNOSIS — Z923 Personal history of irradiation: Secondary | ICD-10-CM | POA: Insufficient documentation

## 2017-06-11 DIAGNOSIS — M25561 Pain in right knee: Secondary | ICD-10-CM

## 2017-06-11 LAB — COMPREHENSIVE METABOLIC PANEL
ALBUMIN: 4 g/dL (ref 3.5–5.0)
ALT: 20 U/L (ref 17–63)
AST: 10 U/L — AB (ref 15–41)
Alkaline Phosphatase: 71 U/L (ref 38–126)
Anion gap: 10 (ref 5–15)
BILIRUBIN TOTAL: 0.4 mg/dL (ref 0.3–1.2)
BUN: 26 mg/dL — AB (ref 6–20)
CO2: 23 mmol/L (ref 22–32)
CREATININE: 1.22 mg/dL (ref 0.61–1.24)
Calcium: 8.9 mg/dL (ref 8.9–10.3)
Chloride: 104 mmol/L (ref 101–111)
GFR calc Af Amer: >60 mL/min (ref >60)
GLUCOSE: 130 mg/dL — AB (ref 65–99)
Potassium: 4.2 mmol/L (ref 3.5–5.1)
Sodium: 137 mmol/L (ref 135–145)
TOTAL PROTEIN: 7.4 g/dL (ref 6.5–8.1)

## 2017-06-11 NOTE — Progress Notes (Signed)
Radiation Oncology Follow up Note  Name: Rick Rosario   Date:   06/11/2017 MRN:  [id number] DOB: May 25, 1956    This 61 y.o. male presents to the clinic today for 1 year follow-up status post I-125 interstitial implant for adenocarcinoma the prostate.  REFERRING PROVIDER: Ezequiel Kayser, MD  HPI: Patient is a 61 year old male now out close to 1 year having completed I-125 interstitial implant for a Gleason 7 (3+4) adenocarcinoma the prostate presenting the PSA of 5. Seen today in routine follow-up he is doing well from a urologic standpoint specifically denies diarrhea dysuria or any other GI/GU complaints. Unfortunately he was recently diagnosed with a pulmonary embolus which is being worked up. 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 He is seen in hematology this afternoon for evaluation. His most recent PSA was 0.7..  COMPLICATIONS OF TREATMENT: none  FOLLOW UP COMPLIANCE: keeps appointments   PHYSICAL EXAM:  BP (!) 143/86   Pulse 84   Temp 98.9 F (37.2 C)   Resp (!) 22   Wt 271 lb 2.7 oz (123 kg)   BMI 33.89 kg/m  On rectal exam rectal sphincter tone is good. Prostate is smooth contracted without evidence of nodularity or mass. Sulcus is preserved bilaterally. No discrete nodularity is identified. No other rectal abnormalities are noted. Well-developed well-nourished patient in NAD. HEENT reveals PERLA, EOMI, discs not visualized.  Oral cavity is clear. No oral mucosal lesions are identified. Neck is clear without evidence of cervical or supraclavicular adenopathy. Lungs are clear to A&P. Cardiac examination is essentially unremarkable with regular rate and rhythm without murmur rub or thrill. Abdomen is benign with no organomegaly or masses noted. Motor sensory and DTR levels are equal and symmetric in the upper and lower extremities. Cranial nerves II through XII are grossly intact. Proprioception  is intact. No peripheral adenopathy or edema is identified. No motor or sensory levels are noted. Crude visual fields are within normal range.  RADIOLOGY RESULTS: CT scans of the chest are reviewed and compatible with the above-stated findings  PLAN: Present time patient from urologic standpoint is doing well with his PSA and a good biochemical control. He is seen hematology this afternoon for evaluation. Also would suggest follow-up MRI to rule out lesion in the tail of his pancreas. Otherwise I've asked to see him back in 1 year for follow-up and will obtain a PSA prior to that visit.  I would like to take this opportunity to thank you for allowing me to participate in the care of your patient.Armstead Peaks., MD

## 2017-06-11 NOTE — Progress Notes (Signed)
Patient here today as new evaluation regarding acute blood clot in lower left lung.  Referred by Dr. Archie Balboa in ED.

## 2017-06-11 NOTE — Progress Notes (Signed)
[facility] day:  06/11/2017  Chief Complaint: Rick Rosario is a 61 y.o. male with a pulmonary embolism who is referred by Dr. Nance Pear for assessment and management.  HPI: The patient presented to the emergency room on 06/08/2017 with chest pain. Patient notes that he was working with his dogs when he developed acute shortness of breath. Chest CT angiogram revealed right middle and right lower lobe segmental/subsegmental pulmonary artery emboli. There was no evidence of right heart strain. There was an ovoid lesion in the tail of the pancreas, incompletely characterized. Further evaluation with MRI of the abdomen with and without contrast was recommended.  He was started on Xarelto.  Patient denies familial clotting disorders.  The patient has a history of prostate cancer. He initially presented with an increase in PSA to 5. He underwent a transrectal ultrasound-guided biopsy on 05/07/2016.  Three of 12 core biopsies were positive for adenocarcinoma, Gleason 7 (3+4). He underwent I-125 interstitial implant by Dr. Baruch Gouty and Dr. Erlene Quan on 07/09/2016.  PSA was 0.89 on 11/28/2016 and 0.71 on 06/04/2017.  Symptomatically, patient feels fine. He has had intermittent non-specific headaches. Patient has had a "few episodes" of chest pain.  He is out of work. Patient notes that he has exertional shortness of breath. Patient denies abdominal symptoms. Last colonoscopy was done by Dr. Gustavo Lah "about 5 years ago". There were no polyps, however he has some diverticulosis. Patient complains of pain in his RIGHT knee. Patient has congential hemi-hypertrophy. The features on the RIGHT side of his body are larger as compared to the left.   Patient is scheduled to fly to [facility] in 3 weeks. He will fly into [place], where he will have a long layover, before continuing on to [place].    Past Medical History:  Diagnosis Date  . Anxiety   . Arthritis    . Cancer [facility])    Prostate  . Depression   . Prostate cancer [facility])     Past Surgical History:  Procedure Laterality Date  . EYE SURGERY Left    Cataract Extraction with IOL  . RADIOACTIVE SEED IMPLANT N/A 07/09/2016   Procedure: RADIOACTIVE SEED IMPLANT/BRACHYTHERAPY IMPLANT;  Surgeon: [name], MD;  Location: [facility] ORS;  Service: Urology;  Laterality: N/A;  . RETINAL DETACHMENT SURGERY Left    [place]     Age 49 years old  . VARICOSE VEIN SURGERY Left   . VOLUME STUDY  06/11/2016   [facility]    Family History  Problem Relation Age of Onset  . Cancer Mother   . Heart attack Father   . Cancer Maternal Aunt   . Cancer Maternal Uncle   . Hematuria Neg Hx   . Renal cancer Neg Hx   . Prostate cancer Neg Hx   . Sickle cell anemia Neg Hx     Social History:  reports that he has never smoked. He has never used smokeless tobacco. He reports that he does not drink alcohol or use drugs.  Patient has never used tobacco. He seldom uses alcohol. Patient is employed full time as a Nature conservation officer. The patient is accompanied by his wife today.  Allergies:  Allergies  Allergen Reactions  . Penicillin G Rash    Caused rash as a child Has patient had a PCN reaction causing immediate rash, facial/tongue/throat swelling, SOB or lightheadedness with hypotension:unsure Has patient had a PCN reaction causing severe rash involving mucus membranes  or skin necrosis:unsure Has patient had a PCN reaction that required hospitalization:NO Has patient had a PCN reaction occurring within the last 10 years:No If all of the above answers are "NO", then may proceed with Cephalosporin use.     Current Medications: Current Outpatient Prescriptions  Medication Sig Dispense Refill  . aspirin EC 81 MG tablet Take 81 mg by mouth daily.     . Garlic 7482 MG CAPS Take 1,000 mg by mouth daily.     . Multiple Vitamin (MULTIVITAMIN WITH MINERALS) TABS tablet Take 1 tablet by mouth  daily.    . naproxen sodium (ANAPROX) 220 MG tablet Take 220 mg by mouth daily as needed (for pain/headache.).     [name] PARoxetine (PAXIL) 20 MG tablet Take 10 mg by mouth daily.     . Rivaroxaban 15 & 20 MG TBPK Take as directed on package: Start with one 79m tablet by mouth twice a day with food. On Day 22, switch to one 227mtablet once a day with food. 51 each 0  . tetrahydrozoline-zinc (VISINE-AC) 0.05-0.25 % ophthalmic solution 1-2 drops 3 (three) times daily as needed (for allergy eyes).     No current facility-administered medications for this visit.     Review of Systems:  GENERAL:  Feels good.  Active.  No fevers, sweats or weight loss. PERFORMANCE STATUS (ECOG):  1* HEENT:  No visual changes, runny nose, sore throat, mouth sores or tenderness. Lungs:  Intermittent shortness of breath.  No cough.  No hemoptysis. Cardiac:  No chest pain, palpitations, orthopnea, or PND. GI:  No nausea, vomiting, diarrhea, constipation, melena or hematochezia. Colonoscopy > 5 years ago (Dr. SkGustavo Lah GU:  No urgency, frequency, dysuria, or hematuria. Musculoskeletal:  No back pain.  Right knee pain, improved.  No muscle tenderness. Extremities:  No pain or swelling. Skin:  No rashes or skin changes. Neuro:  No headache, numbness or weakness, balance or coordination issues. Endocrine:  No diabetes, thyroid issues, hot flashes or night sweats. Psych:  No mood changes, depression or anxiety. Pain:  No focal pain. Review of systems:  All other systems reviewed and found to be negative.  Physical Exam: Blood pressure 125/82, pulse 74, temperature 99 F (37.2 C), temperature source Tympanic, resp. rate 18, weight 271 lb 13.2 oz (123.3 kg). GENERAL:  Well developed, well nourished, man sitting comfortably in the exam room in no acute distress. MENTAL STATUS:  Alert and oriented to person, place and time. HEAD:  GrPearline Cablesair.  Beard.  Normocephalic, atraumatic, face symmetric, no Cushingoid  features. EYES:  Old RIGHT eye droop. Ocular implant in LEFT eye.   Pupils equal round and reactive to light and accomodation.  No conjunctivitis or scleral icterus. ENT:  Oropharynx clear without lesion.  Tongue normal. Mucous membranes moist.  RESPIRATORY:  Clear to auscultation without rales, wheezes or rhonchi. CARDIOVASCULAR:  Regular rate and rhythm without murmur, rub or gallop. ABDOMEN:  Soft, non-tender, with active bowel sounds, and no hepatosplenomegaly.  No masses. SKIN:  No rashes, ulcers or lesions. EXTREMITIES: Hemihypertrophy (R > L side0.  Mild right lower extremity edema.  No skin discoloration or tenderness.  No palpable cords. LYMPH NODES: No palpable cervical, supraclavicular, axillary or inguinal adenopathy  NEUROLOGICAL: Unremarkable. PSYCH:  Appropriate.   Admission on 06/08/2017, Discharged on 06/09/2017  Component Date Value Ref Range Status  . Sodium 06/08/2017 137  135 - 145 mmol/L Final  . Potassium 06/08/2017 3.8  3.5 - 5.1 mmol/L Final  . Chloride 06/08/2017  104  101 - 111 mmol/L Final  . CO2 06/08/2017 24  22 - 32 mmol/L Final  . Glucose, Bld 06/08/2017 109* 65 - 99 mg/dL Final  . BUN 06/08/2017 22* 6 - 20 mg/dL Final  . Creatinine, Ser 06/08/2017 1.64* 0.61 - 1.24 mg/dL Final  . Calcium 06/08/2017 9.1  8.9 - 10.3 mg/dL Final  . GFR calc non Af Amer 06/08/2017 44* >60 mL/min Final  . GFR calc Af Amer 06/08/2017 51* >60 mL/min Final   Comment: (NOTE) The eGFR has been calculated using the CKD EPI equation. This calculation has not been validated in all clinical situations. eGFR's persistently <60 mL/min signify possible Chronic Kidney Disease.   . Anion gap 06/08/2017 9  5 - 15 Final  . WBC 06/08/2017 8.6  3.8 - 10.6 K/uL Final  . RBC 06/08/2017 4.95  4.40 - 5.90 MIL/uL Final  . Hemoglobin 06/08/2017 15.5  13.0 - 18.0 g/dL Final  . HCT 06/08/2017 45.6  40.0 - 52.0 % Final  . MCV 06/08/2017 92.1  80.0 - 100.0 fL Final  . MCH 06/08/2017 31.4  26.0 -  34.0 pg Final  . MCHC 06/08/2017 34.1  32.0 - 36.0 g/dL Final  . RDW 06/08/2017 13.3  11.5 - 14.5 % Final  . Platelets 06/08/2017 210  150 - 440 K/uL Final  . Troponin I 06/08/2017 <0.03  <0.03 ng/mL Final  . Fibrin derivatives D-dimer ([facility]) 06/08/2017 998.35* 0.00 - 499.00 ng/mL (FEU) Final   Comment: (NOTE) <> Exclusion of Venous Thromboembolism (VTE) - OUTPATIENT ONLY   (Emergency Department or [place])   0-499 ng/ml (FEU): With a low to intermediate pretest probability                      for VTE this test result excludes the diagnosis                      of VTE.   >499 ng/ml (FEU) : VTE not excluded; additional work up for VTE is                      required. <> Testing on Inpatients and Evaluation of Disseminated Intravascular   Coagulation (DIC) Reference Range:   0-499 ng/ml (FEU)   . Troponin I 06/08/2017 <0.03  <0.03 ng/mL Final    Assessment:  Rick Rosario is a 61 y.o. male with right sided pulmonary emboli.  He presented with chest pain.  There is no family history of thrombosis.  He is on Xarelto.  Chest CT angiogram on 06/08/2017 revealed right middle and right lower lobe segmental/subsegmental pulmonary artery emboli. There was no evidence of right heart strain. There was an ovoid lesion in the tail of the pancreas, incompletely characterized. Further evaluation with MRI of the abdomen with and without contrast was recommended.  He was started on Xarelto.  He has a history of prostate cancer s/p transrectal ultrasound-guided biopsy on 05/07/2016.  Three of 12 core biopsies were positive for adenocarcinoma, Gleason 7 (3+4). He underwent I-125 interstitial implant on 07/09/2016.  PSA was 0.89 on 11/28/2016 and 0.71 on 06/04/2017.  Symptomatically, he denies any chest pain.  He has intermittent shortness of breath with activity.  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  Plan: 1.  Discuss diagnosis and management of pulmonary embolism.  Discuss risk factors for  thrombosis.  Discuss malignancy associsted thrombosis. 2.  Labs today:  Factor V Leiden, prothrombin gene mutation, lupus anticoagulant  panel, anticardiolipin antibodies, beta2-glycoprotein antibodies, CMP, CA19-9. 3.  Anticipate additional hypercoagulable work-up in 3 months (protein C, protein S, and ATIII). 4.  Schedule MRI of the abdomen with and without contrast to evaluate pancreatic lesion. 5.  Continue anticoagulation (Xarelto) for a minimum of 6 months. Discuss a positive lupus anticoagulant will necessitate lifelong anticoagulation. 6.  Schedule bilateral lower extremity ultrasound to assess for DVT.  7.  RTC after MRI for MD assessment and to review MRI and [place] results.    [name], NP  06/11/2017, 3:49 PM   I saw and evaluated the patient, participating in the key portions of the service and reviewing pertinent diagnostic studies and records.  I reviewed the nurse practitioner's note and agree with the findings and the plan.  The assessment and plan were discussed with the patient.  Additional diagnostic studies of MRI are needed to clarify the pancreatic lesion and would change the clinical management.  Multiple questions were asked by the patient and answered.   Nolon Stalls, MD 06/11/2017, 3:49 PM

## 2017-06-12 ENCOUNTER — Ambulatory Visit: Payer: BLUE CROSS/BLUE SHIELD

## 2017-06-12 ENCOUNTER — Ambulatory Visit
Admission: RE | Admit: 2017-06-12 | Discharge: 2017-06-12 | Disposition: A | Discharge status: Home / Self Care | Payer: BLUE CROSS/BLUE SHIELD | Source: Ambulatory Visit | Attending: Urgent Care | Admitting: Urgent Care

## 2017-06-12 DIAGNOSIS — C61 Malignant neoplasm of prostate: Secondary | ICD-10-CM

## 2017-06-12 DIAGNOSIS — I2699 Other pulmonary embolism without acute cor pulmonale: Secondary | ICD-10-CM | POA: Diagnosis not present

## 2017-06-12 DIAGNOSIS — K869 Disease of pancreas, unspecified: Secondary | ICD-10-CM | POA: Diagnosis not present

## 2017-06-12 LAB — CANCER ANTIGEN 19-9: CA 19-9: 6 U/mL (ref 0–35)

## 2017-06-13 LAB — CARDIOLIPIN ANTIBODIES, IGG, IGM, IGA
Anticardiolipin IgG: 19 GPL U/mL — ABNORMAL HIGH (ref 0–14)
Anticardiolipin IgM: <9 MPL U/mL (ref 0–12)

## 2017-06-14 ENCOUNTER — Encounter: Payer: Self-pay | Admitting: Hematology and Oncology

## 2017-06-14 DIAGNOSIS — I2699 Other pulmonary embolism without acute cor pulmonale: Secondary | ICD-10-CM | POA: Insufficient documentation

## 2017-06-14 LAB — DRVVT CONFIRM: dRVVT Confirm: 1.8 ratio — ABNORMAL HIGH (ref 0.8–1.2)

## 2017-06-14 LAB — LUPUS ANTICOAGULANT PANEL
DRVVT: 100 s — AB (ref 0.0–47.0)
PTT Lupus Anticoagulant: 42.6 s (ref 0.0–51.9)

## 2017-06-14 LAB — BETA-2-GLYCOPROTEIN I ABS, IGG/M/A: Beta-2 Glyco I IgG: <9 GPI IgG units (ref 0–20)

## 2017-06-14 LAB — DRVVT MIX: dRVVT Mix: 67.1 s — ABNORMAL HIGH (ref 0.0–47.0)

## 2017-06-17 LAB — FACTOR 5 LEIDEN

## 2017-06-17 LAB — PROTHROMBIN GENE MUTATION

## 2017-06-19 ENCOUNTER — Ambulatory Visit
Admission: RE | Admit: 2017-06-19 | Discharge: 2017-06-19 | Disposition: A | Discharge status: Home / Self Care | Payer: BLUE CROSS/BLUE SHIELD | Source: Ambulatory Visit | Attending: Urgent Care | Admitting: Urgent Care

## 2017-06-19 DIAGNOSIS — K869 Disease of pancreas, unspecified: Secondary | ICD-10-CM | POA: Insufficient documentation

## 2017-06-19 DIAGNOSIS — C61 Malignant neoplasm of prostate: Secondary | ICD-10-CM | POA: Diagnosis present

## 2017-06-19 DIAGNOSIS — I2699 Other pulmonary embolism without acute cor pulmonale: Secondary | ICD-10-CM | POA: Diagnosis not present

## 2017-06-19 MED ORDER — GADOBENATE DIMEGLUMINE 529 MG/ML IV SOLN
20.0000 mL | Freq: Once | INTRAVENOUS | Status: AC | PRN
  Administered 2017-06-19: 20 mL via INTRAVENOUS

## 2017-06-25 ENCOUNTER — Other Ambulatory Visit: Payer: Self-pay | Admitting: *Deleted

## 2017-06-25 ENCOUNTER — Encounter: Payer: Self-pay | Admitting: Hematology and Oncology

## 2017-06-25 ENCOUNTER — Inpatient Hospital Stay: Payer: BLUE CROSS/BLUE SHIELD | Attending: Hematology and Oncology | Admitting: Hematology and Oncology

## 2017-06-25 VITALS — BP 127/80 | HR 55 | Temp 98.4°F | Resp 18 | Wt 281.5 lb

## 2017-06-25 DIAGNOSIS — Z7901 Long term (current) use of anticoagulants: Secondary | ICD-10-CM | POA: Diagnosis not present

## 2017-06-25 DIAGNOSIS — R5383 Other fatigue: Secondary | ICD-10-CM

## 2017-06-25 DIAGNOSIS — I2699 Other pulmonary embolism without acute cor pulmonale: Secondary | ICD-10-CM

## 2017-06-25 DIAGNOSIS — F419 Anxiety disorder, unspecified: Secondary | ICD-10-CM | POA: Diagnosis not present

## 2017-06-25 DIAGNOSIS — F329 Major depressive disorder, single episode, unspecified: Secondary | ICD-10-CM | POA: Diagnosis not present

## 2017-06-25 DIAGNOSIS — K869 Disease of pancreas, unspecified: Secondary | ICD-10-CM | POA: Diagnosis not present

## 2017-06-25 DIAGNOSIS — Z79899 Other long term (current) drug therapy: Secondary | ICD-10-CM | POA: Diagnosis not present

## 2017-06-25 DIAGNOSIS — R760 Raised antibody titer: Secondary | ICD-10-CM | POA: Insufficient documentation

## 2017-06-25 DIAGNOSIS — Z7982 Long term (current) use of aspirin: Secondary | ICD-10-CM | POA: Diagnosis not present

## 2017-06-25 DIAGNOSIS — Z8546 Personal history of malignant neoplasm of prostate: Secondary | ICD-10-CM | POA: Insufficient documentation

## 2017-06-25 MED ORDER — RIVAROXABAN 20 MG PO TABS: 20.0000 mg | ORAL_TABLET | Freq: Every day | ORAL | 3 refills | Status: DC

## 2017-06-25 NOTE — Progress Notes (Signed)
Patient offers no complaints today.  Patient here today for MRI and [place] results.

## 2017-06-25 NOTE — Progress Notes (Signed)
[facility] day:  06/25/2017  Chief Complaint: Rick Rosario is a 61 y.o. male with a pulmonary embolism who is seen for review of imaging and initial laboratory testing.  HPI: The patient was last seen in the medical oncology clinic on 06/11/2017 for initial assessment.  At that time, he had recently been diagnosed with a right sided pulmonary embolism and was on Xarelto.  He no longer had chest pain, but had some shortness of breath with exertion.  Chest CT angiogram had noted an ovoid lesion in the tail of the pancreas of unclear etiology.  Labs on 06/11/2017 revealed the following normal labs:  Factor V Leiden, prothrombin gene mutation, and beta2-glycoprotein.  Lupus anticoagulant testing was positive on Xarelto.  Anticardiolipin IgG was 19 (indeterminate).  CA19-1 was 6 (normal).  Bilateral lower extremity duplex on 06/12/2017 revealed no evidence of DVT.  MRI abdomen on 06/19/2017 revealed no evidence of pancreatic mass or other significant abnormality.  During the interim, he has felt "ok".  He notes some fatigue which he thinks might be related to the weather.  He is working.  He denies any bruising or bleeding.   Past Medical History:  Diagnosis Date  . Anxiety   . Arthritis   . Cancer [facility])    Prostate  . Depression   . Prostate cancer [facility])     Past Surgical History:  Procedure Laterality Date  . EYE SURGERY Left    Cataract Extraction with IOL  . RETINAL DETACHMENT [facility]     Age 32 years old  . VARICOSE VEIN SURGERY Left   . VOLUME STUDY  06/11/2016   [facility]    Family History  Problem Relation Age of Onset  . Cancer Mother   . Heart attack Father   . Cancer Maternal Aunt   . Cancer Maternal Uncle   . Hematuria Neg Hx   . Renal cancer Neg Hx   . Prostate cancer Neg Hx   . Sickle cell anemia Neg Hx     Social History:  reports that  has never smoked. he has never used  smokeless tobacco. He reports that he does not drink alcohol or use drugs.  Patient has never used tobacco. He seldom uses alcohol. Patient is employed full time as a Nature conservation officer. The patient is accompanied by his wife today.  Allergies:  Allergies  Allergen Reactions  . Penicillin G Rash    Caused rash as a child Has patient had a PCN reaction causing immediate rash, facial/tongue/throat swelling, SOB or lightheadedness with hypotension:unsure Has patient had a PCN reaction causing severe rash involving mucus membranes or skin necrosis:unsure Has patient had a PCN reaction that required hospitalization:NO Has patient had a PCN reaction occurring within the last 10 years:No If all of the above answers are "NO", then may proceed with Cephalosporin use.     Current Medications: Current Outpatient Medications  Medication Sig Dispense Refill  . aspirin EC 81 MG tablet Take 81 mg by mouth daily.     . Garlic 1610 MG CAPS Take 1,000 mg by mouth daily.     . Multiple Vitamin (MULTIVITAMIN WITH MINERALS) TABS tablet Take 1 tablet by mouth daily.    . naproxen sodium (ANAPROX) 220 MG tablet Take 220 mg by mouth daily as needed (for pain/headache.).     [name] PARoxetine (PAXIL) 20 MG tablet Take 10 mg by mouth daily.     [name]  Rivaroxaban 15 & 20 MG TBPK Take as directed on package: Start with one 1m tablet by mouth twice a day with food. On Day 22, switch to one 218mtablet once a day with food. 51 each 0  . tetrahydrozoline-zinc (VISINE-AC) 0.05-0.25 % ophthalmic solution 1-2 drops 3 (three) times daily as needed (for allergy eyes).    . rivaroxaban (XARELTO) 20 MG TABS tablet Take 1 tablet (20 mg total) daily with supper by mouth. 30 tablet 3   No current facility-administered medications for this visit.     Review of Systems:  GENERAL:  Feels little tired.  No fevers or sweats.  Weight up 10 pounds. PERFORMANCE STATUS (ECOG):  1. HEENT:  No visual changes, runny nose, sore throat, mouth  sores or tenderness. Lungs:  No increased shortness of breath.  No cough.  No hemoptysis. Cardiac:  No chest pain, palpitations, orthopnea, or PND. GI:  No nausea, vomiting, diarrhea, constipation, melena or hematochezia. Colonoscopy > 5 years ago (Dr. SkGustavo Lah GU:  No urgency, frequency, dysuria, or hematuria. Musculoskeletal:  No back pain.  Right knee pain, improved.  No muscle tenderness. Extremities:  No pain or swelling. Skin:  No rashes or skin changes. Neuro:  No headache, numbness or weakness, balance or coordination issues. Endocrine:  No diabetes, thyroid issues, hot flashes or night sweats. Psych:  No mood changes, depression or anxiety. Pain:  No focal pain. Review of systems:  All other systems reviewed and found to be negative.  Physical Exam: Blood pressure 127/80, pulse (!) 55, temperature 98.4 F (36.9 C), temperature source Tympanic, resp. rate 18, weight 281 lb 8.4 oz (127.7 kg). GENERAL:  Well developed, well nourished, man sitting comfortably in the exam room in no acute distress. MENTAL STATUS:  Alert and oriented to person, place and time. HEAD:  GrPearline Cablesair.  Beard.  Normocephalic, atraumatic, face symmetric, no Cushingoid features. EYES:  Old RIGHT eye droop. Ocular implant in LEFT eye.  No conjunctivitis or scleral icterus. NEUROLOGICAL: Unremarkable. PSYCH:  Appropriate.   No visits with results within 3 Day(s) from this visit.  Latest known visit with results is:  Clinical Support on 06/11/2017  Component Date Value Ref Range Status  . Recommendations-F5LEID: 06/11/2017 Comment   Final   Comment: (NOTE) Result:  Negative (no mutation found) Factor V Leiden is a specific mutation (R506Q) in the factor V gene that is associated with an increased risk of venous thrombosis. Factor V Leiden is more resistant to inactivation by activated protein C.  As a result, factor V persists in the circulation leading to a mild hyper- coagulable state.  The Leiden  mutation accounts for 90% - 95% of APC resistance.  Factor V Leiden has been reported in patients with deep vein thrombosis, pulmonary embolus, central retinal vein occlusion, cerebral sinus thrombosis and hepatic vein thrombosis. Other risk factors to be considered in the workup for venous thrombosis include the G20210A mutation in the factor II (prothrombin) gene, protein S and C deficiency, and antithrombin deficiencies. Anticardiolipin antibody and lupus anticoagulant analysis may be appropriate for certain patients, as well as homocysteine levels. Contact your local [facility] for information on how to order additi                          onal testing if desired. **Genetic counselors are available for health care providers to**  discuss results at 1-800-345-GENE ([phone] Methodology: DNA analysis of the Factor V gene was performed by allele-specific  PCR. The diagnostic sensitivity and specificity is >99% for both. Molecular-based testing is highly accurate, but as in any laboratory test, diagnostic errors may occur. All test results must be combined with clinical information for the most accurate interpretation. This test was developed and its performance characteristics determined by [facility]. It has not been cleared or approved by the Food and Drug Administration. References: Voelkerding K (1996).  Clin Lab Med [phone]. Allison Quarry, PhD, [facility] Ruben Reason, PhD, [facility] Annetta Maw, M.S., PhD, [facility] Alfredo Bach, PhD, [facility] Norva Riffle, PhD, [facility] Earlean Polka PhD, [facility] Performed At: [facility] [address] [place], [place] [id number] Nechama Guard MD [id number]                          2   . Recommendations-PTGENE: 06/11/2017 Comment   Final   Comment: (NOTE) NEGATIVE No mutation identified. Comment: A point mutation (G20210A) in the factor II (prothrombin) gene is the second most common cause of inherited thrombophilia.  The incidence of this mutation in the [place] Caucasian population is about 2% and in the [place] American population it is approximately 0.5%. This mutation is rare in the [place] and Native American population. Being heterozygous for a prothrombin mutation increases the risk for developing venous thrombosis about 2 to 3 times above the general population risk. Being homozygous for the prothrombin gene mutation increases the relative risk for venous thrombosis further, although it is not yet known how much further the risk is increased. In women heterozygous for the prothrombin gene mutation, the use of estrogen containing oral contraceptives increases the relative risk of venous thrombosis about 16 times and the risk of developing cerebral thrombosis is also significantly increased. In pregnancy the pr                          othrombin gene mutation increases risk for venous thrombosis and may increase risk for stillbirth, placental abruption, pre-eclampsia and fetal growth restriction. If the patient possesses two or more congenital or acquired thrombophilic risk factors, the risk for thrombosis may rise to more than the sum of the risk ratios for the individual mutations. This assay detects only the prothrombin G20210A mutation and does not measure genetic abnormalities elsewhere in the genome. Other thrombotic risk factors may be pursued through systematic clinical laboratory analysis. These factors include the R506Q (Leiden) mutation in the Factor V gene, plasma homocysteine levels, as well as testing for deficiencies of antithrombin III, protein C and protein S. Genetic Counselors are available for health care providers to discuss results at 1-800-345-GENE [phone]). Methodology: DNA analysis of the Factor II gene was performed by PCR amplification followed by restriction analysis. The di                          agnostic sensitivity is >99% for both. All the tests must be combined  with clinical information for the most accurate interpretation. Molecular-based testing is highly accurate, but as in any laboratory test, diagnostic errors may occur. This test was developed and its performance characteristics determined by [facility]. It has not been cleared or approved by the Food and Drug Administration. Poort SR, et al. Blood. 1996; 41:3244-0102. Varga EA. Circulation. 2004; 725:D66-Y40. Mervin Hack, et Winkler; 19:700-703. Allison Quarry, PhD, [facility] Ruben Reason, PhD, [facility] Annetta Maw, M.S., PhD, [facility] Alfredo Bach, PhD,  [facility] Norva Riffle, PhD, [facility] Earlean Polka, PhD, [facility] Performed At: [facility] [address] [place], [place] [id number] Nechama Guard MD YH:[id number]   . PTT Lupus Anticoagulant 06/11/2017 42.6  0.0 - 51.9 sec Final  . DRVVT 06/11/2017 100.0* 0.0 - 47.0 sec Final  . Lupus Anticoag Interp 06/11/2017 Comment:   Corrected   Comment: (NOTE) Results are consistent with the presence of a lupus anticoagulant. NOTE: Only persistent lupus anticoagulants are thought to be of clinical significance. For this reason, repeat testing in 12 or more weeks after an initial positive result should be considered to confirm or refute the presence of a lupus anticoagulant, depending on clinical presentation. Results of lupus anticoagulant tests may be falsely positive in the presence of certain anticoagulant therapies. Performed At: [facility] [place], [place] [id number] Rush Farmer MD XT:[id number]   . Anticardiolipin IgG 06/11/2017 19* 0 - 14 GPL U/mL Final   Comment: (NOTE)                          Negative:              <15                          Indeterminate:     15 - 20                          Low-Med Positive: >20 - 80                          High Positive:         >80   . Anticardiolipin IgM 06/11/2017 <9  0 - 12 MPL U/mL Final   Comment: (NOTE)                           Negative:              <13                          Indeterminate:     13 - 20                          Low-Med Positive: >20 - 80                          High Positive:         >80   . Anticardiolipin IgA 06/11/2017 <9  0 - 11 APL U/mL Final   Comment: (NOTE)                          Negative:              <12                          Indeterminate:     12 - 20                          Low-Med Positive: >20 - 80  High Positive:         >80 Performed At: [facility] [place], [place] [id number] Lindon Romp MD MV:[id number]   . Beta-2 Glyco I IgG 06/11/2017 <9  0 - 20 GPI IgG units Final   Comment: (NOTE) The reference interval reflects a 3SD or 99th percentile interval, which is thought to represent a potentially clinically significant result in accordance with the International Consensus Statement on the classification criteria for definitive antiphospholipid syndrome (APS). J Thromb Haem 2006;4:295-306.   . Beta-2-Glycoprotein I IgM 06/11/2017 <9  0 - 32 GPI IgM units Final   Comment: (NOTE) The reference interval reflects a 3SD or 99th percentile interval, which is thought to represent a potentially clinically significant result in accordance with the International Consensus Statement on the classification criteria for definitive antiphospholipid syndrome (APS). J Thromb Haem 2006;4:295-306. Performed At: [facility] [place], [place] [id number] Rush Farmer MD UU:[id number]   . Beta-2-Glycoprotein I IgA 06/11/2017 <9  0 - 25 GPI IgA units Final   Comment: (NOTE) The reference interval reflects a 3SD or 99th percentile interval, which is thought to represent a potentially clinically significant result in accordance with the International Consensus Statement on the classification criteria for definitive antiphospholipid syndrome (APS). J Thromb Haem 2006;4:295-306.   . Sodium  06/11/2017 137  135 - 145 mmol/L Final  . Potassium 06/11/2017 4.2  3.5 - 5.1 mmol/L Final  . Chloride 06/11/2017 104  101 - 111 mmol/L Final  . CO2 06/11/2017 23  22 - 32 mmol/L Final  . Glucose, Bld 06/11/2017 130* 65 - 99 mg/dL Final  . BUN 06/11/2017 26* 6 - 20 mg/dL Final  . Creatinine, Ser 06/11/2017 1.22  0.61 - 1.24 mg/dL Final  . Calcium 06/11/2017 8.9  8.9 - 10.3 mg/dL Final  . Total Protein 06/11/2017 7.4  6.5 - 8.1 g/dL Final  . Albumin 06/11/2017 4.0  3.5 - 5.0 g/dL Final  . AST 06/11/2017 10* 15 - 41 U/L Final  . ALT 06/11/2017 20  17 - 63 U/L Final  . Alkaline Phosphatase 06/11/2017 71  38 - 126 U/L Final  . Total Bilirubin 06/11/2017 0.4  0.3 - 1.2 mg/dL Final  . GFR calc non Af Amer 06/11/2017 >60  >60 mL/min Final  . GFR calc Af Amer 06/11/2017 >60  >60 mL/min Final   Comment: (NOTE) The eGFR has been calculated using the CKD EPI equation. This calculation has not been validated in all clinical situations. eGFR's persistently <60 mL/min signify possible Chronic Kidney Disease.   . Anion gap 06/11/2017 10  5 - 15 Final  . CA 19-9 06/11/2017 6  0 - 35 U/mL Final   Comment: (NOTE) Roche ECLIA methodology Performed At: [facility] [place], [place] [id number] Lindon Romp MD OV:[id number]   . dRVVT Mix 06/11/2017 67.1* 0.0 - 47.0 sec Final   Comment: (NOTE) Performed At: [facility] [place], [place] [id number] Rush Farmer MD ZS:[id number]   . dRVVT Confirm 06/11/2017 1.8* 0.8 - 1.2 ratio Final   Comment: (NOTE) Performed At: [facility] [place], [place] [id number] Rush Farmer MD [id number]     Assessment:  SIRE POET is a 61 y.o. male with right sided pulmonary emboli.  He presented with chest pain.  There is no family history of thrombosis.  He is on Xarelto.  Chest CT angiogram on 06/08/2017 revealed right middle and right lower lobe segmental/subsegmental  pulmonary  artery emboli. There was no evidence of right heart strain. There was an ovoid lesion in the tail of the pancreas, incompletely characterized. Further evaluation with MRI of the abdomen with and without contrast was recommended.   MRI abdomen on 06/19/2017 revealed no evidence of pancreatic mass or other significant abnormality.  Bilateral lower extremity duplex on 06/12/2017 revealed no evidence of DVT.  Work-up on 06/11/2017 revealed the following normal labs:  Factor V Leiden, prothrombin gene mutation, and beta2-glycoprotein.  Lupus anticoagulant testing was positive on Xarelto.  Anticardiolipin IgG was 19 (indeterminate).  CA19-1 was 6 (normal).  He has a history of prostate cancer s/p transrectal ultrasound-guided biopsy on 05/07/2016.  Three of 12 core biopsies were positive for adenocarcinoma, Gleason 7 (3+4). He underwent I-125 interstitial implant on 07/09/2016.  PSA was 0.89 on 11/28/2016 and 0.71 on 06/04/2017.  Symptomatically, he is slightly fatigued.  He denies any chest pain.  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  Plan: 1.  Review hypercoagulable work-up to date.  Possible lupus anticoagulant.  2.  Discuss lower extremity duplex- no DVT. 3.  Discuss abdomen MRI- no pancreatic mass.  CA19-9 is normal. 4.  Continue Xarelto for a minimum of 6 months.  Discuss retesting for lupus anticoagulant after 6 months of anticoagulation.  A confirmed positive lupus anticoagulant will necessitate lifelong anticoagulation. 5.  Rx: Xarelto 20 mg a day after completion of initial starter pack. 6.  RTC in 10 weeks for MD assessment and labs (CBC with diff, CMP, anticardiolipin antibodies, protein C, protein S, and ATIII: antigen and activity).   Lequita Asal, MD  06/25/2017, 1:47 PM

## 2017-09-01 IMAGING — DX DG PORTABLE PELVIS
1 series · 1 of 1 positions shown · non-contrast
Comparison: None.

CLINICAL DATA: Seed implantation.

EXAM:
PORTABLE PELVIS 1-2 VIEWS

[pelvis ap]
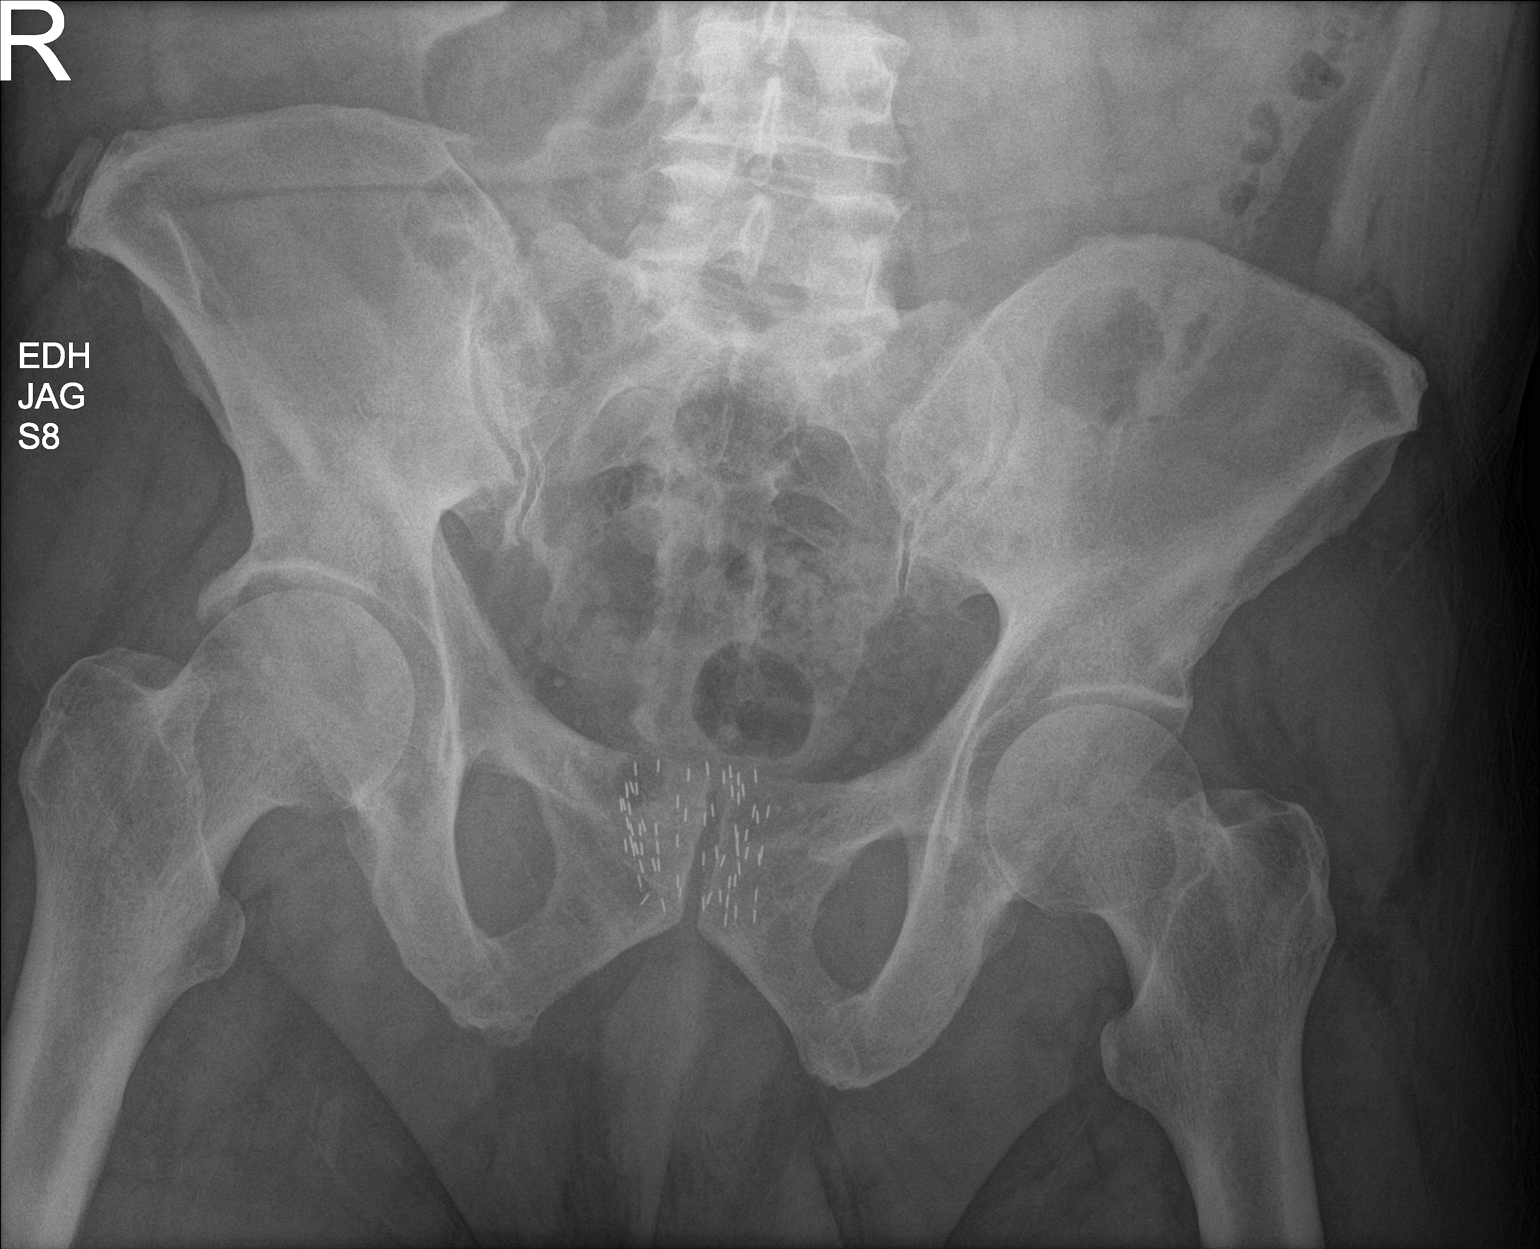

[1 of 1 positions shown; findings below may reference images not displayed]

FINDINGS: Seed implants are present throughout the expected location of the
prostate. No migrated or embolized seeds are noted in the region of
the pelvis. Bony structures are normal except for some
osteoarthritis of the sacroiliac joints.
IMPRESSION: Good appearance following seed implants.

## 2017-09-03 ENCOUNTER — Inpatient Hospital Stay: Payer: BLUE CROSS/BLUE SHIELD

## 2017-09-03 ENCOUNTER — Inpatient Hospital Stay: Payer: BLUE CROSS/BLUE SHIELD | Attending: Hematology and Oncology | Admitting: Hematology and Oncology

## 2017-09-03 ENCOUNTER — Encounter: Payer: Self-pay | Admitting: Hematology and Oncology

## 2017-09-03 VITALS — BP 132/83 | HR 58 | Temp 98.3°F | Resp 18 | Wt 271.2 lb

## 2017-09-03 DIAGNOSIS — I2699 Other pulmonary embolism without acute cor pulmonale: Secondary | ICD-10-CM | POA: Insufficient documentation

## 2017-09-03 DIAGNOSIS — Z8546 Personal history of malignant neoplasm of prostate: Secondary | ICD-10-CM | POA: Insufficient documentation

## 2017-09-03 DIAGNOSIS — R760 Raised antibody titer: Secondary | ICD-10-CM | POA: Diagnosis not present

## 2017-09-03 DIAGNOSIS — Z7901 Long term (current) use of anticoagulants: Secondary | ICD-10-CM | POA: Diagnosis not present

## 2017-09-03 LAB — CBC WITH DIFFERENTIAL/PLATELET
Basophils Absolute: 0 10*3/uL (ref 0–0.1)
Basophils Relative: 1 %
Eosinophils Absolute: 0.3 10*3/uL (ref 0–0.7)
Eosinophils Relative: 4 %
HCT: 45.2 % (ref 40.0–52.0)
Hemoglobin: 15.9 g/dL (ref 13.0–18.0)
Lymphocytes Relative: 24 %
Lymphs Abs: 1.5 10*3/uL (ref 1.0–3.6)
MCH: 31.9 pg (ref 26.0–34.0)
MCHC: 35.2 g/dL (ref 32.0–36.0)
MCV: 90.7 fL (ref 80.0–100.0)
Monocytes Absolute: 0.6 10*3/uL (ref 0.2–1.0)
Monocytes Relative: 10 %
Neutro Abs: 3.7 10*3/uL (ref 1.4–6.5)
Neutrophils Relative %: 61 %
Platelets: 224 10*3/uL (ref 150–440)
RBC: 4.98 MIL/uL (ref 4.40–5.90)
RDW: 13.3 % (ref 11.5–14.5)
WBC: 6.1 10*3/uL (ref 3.8–10.6)

## 2017-09-03 LAB — COMPREHENSIVE METABOLIC PANEL
ALT: 22 U/L (ref 17–63)
AST: 24 U/L (ref 15–41)
Albumin: 4.1 g/dL (ref 3.5–5.0)
Alkaline Phosphatase: 60 U/L (ref 38–126)
Anion gap: 8 (ref 5–15)
BUN: 20 mg/dL (ref 6–20)
CO2: 24 mmol/L (ref 22–32)
Calcium: 9 mg/dL (ref 8.9–10.3)
Chloride: 104 mmol/L (ref 101–111)
Creatinine, Ser: 1.14 mg/dL (ref 0.61–1.24)
GFR calc Af Amer: >60 mL/min (ref >60)
GFR calc non Af Amer: >60 mL/min (ref >60)
Glucose, Bld: 110 mg/dL — ABNORMAL HIGH (ref 65–99)
Potassium: 4.4 mmol/L (ref 3.5–5.1)
Sodium: 136 mmol/L (ref 135–145)
Total Bilirubin: 0.9 mg/dL (ref 0.3–1.2)
Total Protein: 7.3 g/dL (ref 6.5–8.1)

## 2017-09-03 LAB — ANTITHROMBIN III: AntiThromb III Func: 99 % (ref 75–120)

## 2017-09-03 NOTE — Progress Notes (Signed)
Patient here today for follow up regarding PE.  No complaints today.

## 2017-09-03 NOTE — Progress Notes (Signed)
[facility] day:  09/03/2017  Chief Complaint: Rick Rosario is a 62 y.o. male with a pulmonary embolism who is seen for 10 week assessment and addition testing for a hypercoagulable work-up.  HPI: The patient was last seen in the medical oncology clinic on 06/25/2017.  At that time, he was slightly fatigued.  He denied any chest pain.  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  He continued Xarelto.  During the interim, he has done well.  He denies any symptoms.  He is taking Xarelto.  He denies any bruising or bleeding.  He denies any shortness of breath, pleuritic chest pain or lower extremity edema.   Past Medical History:  Diagnosis Date  . Anxiety   . Arthritis   . Cancer [facility])    Prostate  . Depression   . Prostate cancer [facility])     Past Surgical History:  Procedure Laterality Date  . EYE SURGERY Left    Cataract Extraction with IOL  . RADIOACTIVE SEED IMPLANT N/A 07/09/2016   Procedure: RADIOACTIVE SEED IMPLANT/BRACHYTHERAPY IMPLANT;  Surgeon: [name], MD;  Location: [facility] ORS;  Service: Urology;  Laterality: N/A;  . RETINAL DETACHMENT SURGERY Left    [place]     Age 6 years old  . VARICOSE VEIN SURGERY Left   . VOLUME STUDY  06/11/2016   [facility]    Family History  Problem Relation Age of Onset  . Cancer Mother   . Heart attack Father   . Cancer Maternal Aunt   . Cancer Maternal Uncle   . Hematuria Neg Hx   . Renal cancer Neg Hx   . Prostate cancer Neg Hx   . Sickle cell anemia Neg Hx     Social History:  reports that  has never smoked. he has never used smokeless tobacco. He reports that he does not drink alcohol or use drugs.  Patient has never used tobacco. He seldom uses alcohol. Patient is employed full time as a Nature conservation officer. The patient is alone today.  Allergies:  Allergies  Allergen Reactions  . Penicillin G Rash    Caused rash as a child Has patient had a PCN reaction  causing immediate rash, facial/tongue/throat swelling, SOB or lightheadedness with hypotension:unsure Has patient had a PCN reaction causing severe rash involving mucus membranes or skin necrosis:unsure Has patient had a PCN reaction that required hospitalization:NO Has patient had a PCN reaction occurring within the last 10 years:No If all of the above answers are "NO", then may proceed with Cephalosporin use.     Current Medications: Current Outpatient Medications  Medication Sig Dispense Refill  . aspirin EC 81 MG tablet Take 81 mg by mouth daily.     . Garlic 8119 MG CAPS Take 1,000 mg by mouth daily.     . Multiple Vitamin (MULTIVITAMIN WITH MINERALS) TABS tablet Take 1 tablet by mouth daily.    . naproxen sodium (ANAPROX) 220 MG tablet Take 220 mg by mouth daily as needed (for pain/headache.).     [name] PARoxetine (PAXIL) 20 MG tablet Take 10 mg by mouth daily.     . rivaroxaban (XARELTO) 20 MG TABS tablet Take 1 tablet (20 mg total) daily with supper by mouth. 30 tablet 3  . tetrahydrozoline-zinc (VISINE-AC) 0.05-0.25 % ophthalmic solution 1-2 drops 3 (three) times daily as needed (for allergy eyes).     No current facility-administered medications for this visit.     Review of  Systems:  GENERAL:  Feels "alright".  No fevers or sweats.  Weight down 10 pounds.  Weight stable from 06/11/2017. PERFORMANCE STATUS (ECOG):  0. HEENT:  No visual changes, runny nose, sore throat, mouth sores or tenderness. Lungs:  No increased shortness of breath.  No cough.  No hemoptysis. Cardiac:  No chest pain, palpitations, orthopnea, or PND. GI:  No nausea, vomiting, diarrhea, constipation, melena or hematochezia. Colonoscopy > 5 years ago (Dr. Gustavo Lah). GU:  No urgency, frequency, dysuria, or hematuria. Musculoskeletal:  No back pain.  Right knee pain, improved.  No muscle tenderness. Extremities:  No pain or swelling. Skin:  No rashes or skin changes. Neuro:  No headache, numbness or weakness,  balance or coordination issues. Endocrine:  No diabetes, thyroid issues, hot flashes or night sweats. Psych:  No mood changes, depression or anxiety. Pain:  No focal pain. Review of systems:  All other systems reviewed and found to be negative.  Physical Exam: Blood pressure 132/83, pulse (!) 58, temperature 98.3 F (36.8 C), temperature source Tympanic, resp. rate 18, weight 271 lb 2.7 oz (123 kg). GENERAL:  Well developed, well nourished, man sitting comfortably in the exam room in no acute distress. MENTAL STATUS:  Alert and oriented to person, place and time. HEAD:  Pearline Cables hair and beard.  Normocephalic, atraumatic, face symmetric, no Cushingoid features. EYES:  Old RIGHT eye droop. Ocular implant in LEFT eye.  No conjunctivitis or scleral icterus. ENT:  Oropharynx clear without lesion.  Tongue normal. Mucous membranes moist.  RESPIRATORY:  Clear to auscultation without rales, wheezes or rhonchi. CARDIOVASCULAR:  Regular rate and rhythm without murmur, rub or gallop. ABDOMEN:  Soft, non-tender, with active bowel sounds, and no hepatosplenomegaly.  No masses. SKIN:  No rashes, ulcers or lesions. 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  Mild right lower extremity edema.  No skin discoloration or tenderness.  No palpable cords. LYMPH NODES: No palpable cervical, supraclavicular, axillary or inguinal adenopathy  NEUROLOGICAL: Unremarkable. PSYCH:  Appropriate   Appointment on 09/03/2017  Component Date Value Ref Range Status  . WBC 09/03/2017 6.1  3.8 - 10.6 K/uL Final  . RBC 09/03/2017 4.98  4.40 - 5.90 MIL/uL Final  . Hemoglobin 09/03/2017 15.9  13.0 - 18.0 g/dL Final  . HCT 09/03/2017 45.2  40.0 - 52.0 % Final  . MCV 09/03/2017 90.7  80.0 - 100.0 fL Final  . MCH 09/03/2017 31.9  26.0 - 34.0 pg Final  . MCHC 09/03/2017 35.2  32.0 - 36.0 g/dL Final  . RDW 09/03/2017 13.3  11.5 - 14.5 % Final  . Platelets 09/03/2017 224  150 - 440 K/uL Final  . Neutrophils Relative % 09/03/2017  61  % Final  . Neutro Abs 09/03/2017 3.7  1.4 - 6.5 K/uL Final  . Lymphocytes Relative 09/03/2017 24  % Final  . Lymphs Abs 09/03/2017 1.5  1.0 - 3.6 K/uL Final  . Monocytes Relative 09/03/2017 10  % Final  . Monocytes Absolute 09/03/2017 0.6  0.2 - 1.0 K/uL Final  . Eosinophils Relative 09/03/2017 4  % Final  . Eosinophils Absolute 09/03/2017 0.3  0 - 0.7 K/uL Final  . Basophils Relative 09/03/2017 1  % Final  . Basophils Absolute 09/03/2017 0.0  0 - 0.1 K/uL Final   Performed at [facility], [address], [place], [place] [address]  . Sodium 09/03/2017 136  135 - 145 mmol/L Final  . Potassium 09/03/2017 4.4  3.5 - 5.1 mmol/L Final  . Chloride 09/03/2017 104  101 - 111 mmol/L Final  . CO2 09/03/2017 24  22 - 32 mmol/L Final  . Glucose, Bld 09/03/2017 110* 65 - 99 mg/dL Final  . BUN 09/03/2017 20  6 - 20 mg/dL Final  . Creatinine, Ser 09/03/2017 1.14  0.61 - 1.24 mg/dL Final  . Calcium 09/03/2017 9.0  8.9 - 10.3 mg/dL Final  . Total Protein 09/03/2017 7.3  6.5 - 8.1 g/dL Final  . Albumin 09/03/2017 4.1  3.5 - 5.0 g/dL Final  . AST 09/03/2017 24  15 - 41 U/L Final  . ALT 09/03/2017 22  17 - 63 U/L Final  . Alkaline Phosphatase 09/03/2017 60  38 - 126 U/L Final  . Total Bilirubin 09/03/2017 0.9  0.3 - 1.2 mg/dL Final  . GFR calc non Af Amer 09/03/2017 >60  >60 mL/min Final  . GFR calc Af Amer 09/03/2017 >60  >60 mL/min Final   Comment: (NOTE) The eGFR has been calculated using the CKD EPI equation. This calculation has not been validated in all clinical situations. eGFR's persistently <60 mL/min signify possible Chronic Kidney Disease.   Georgiann Hahn gap 09/03/2017 8  5 - 15 Final   Performed at [facility], [address] [place], [place] [address]  . AntiThromb III Func 09/03/2017 99  75 - 120 % Final   Performed at [facility] [address], [place], [place] [address]    Assessment:  Rick Rosario is a 63 y.o. male with right sided  pulmonary emboli.  He presented with chest pain.  There is no family history of thrombosis.  He is on Xarelto.  Chest CT angiogram on 06/08/2017 revealed right middle and right lower lobe segmental/subsegmental pulmonary artery emboli. There was no evidence of right heart strain. There was an ovoid lesion in the tail of the pancreas, incompletely characterized. Further evaluation with MRI of the abdomen with and without contrast was recommended.   MRI abdomen on 06/19/2017 revealed no evidence of pancreatic mass or other significant abnormality.  Bilateral lower extremity duplex on 06/12/2017 revealed no evidence of DVT.  Work-up on 06/11/2017 revealed the following normal labs:  Factor V Leiden, prothrombin gene mutation, and beta2-glycoprotein.  Lupus anticoagulant testing was positive on Xarelto.  Anticardiolipin IgG was 19 (indeterminate).  CA19-1 was 6 (normal).  He has a history of prostate cancer s/p transrectal ultrasound-guided biopsy on 05/07/2016.  Three of 12 core biopsies were positive for adenocarcinoma, Gleason 7 (3+4). He underwent I-125 interstitial implant on 07/09/2016.  PSA was 0.89 on 11/28/2016 and 0.71 on 06/04/2017.  Symptomatically, he feels "alright".  He denies any symptoms.  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  Plan: 1.  Labs today: CBC with diff, CMP, anticardiolipin antibodies, protein C, protein S, and ATIII: antigen and activity. 2.  Continue Xarelto. 3.  Discuss plan to hold Xarelto for 2 days prior to lupus anticoagulant testing on 12/05/2017 then resume anticoagulation after test drawn. 4.  RTC on 12/05/2017 for labs (lupus anticoagulant). 5.  RTC on 12/10/2017 for MD assessment, review of labs, and discussion regarding length anticoagulation.   Lequita Asal, MD  09/03/2017, 1:26 PM

## 2017-09-04 LAB — PROTEIN C AG + PROTEIN S AG
Protein C, Total: 81 % (ref 60–150)
Protein S Ag, Free: 130 % (ref 57–157)
Protein S Ag, Total: 85 % (ref 60–150)

## 2017-09-04 LAB — PROTEIN C ACTIVITY: Protein C Activity: 104 % (ref 73–180)

## 2017-09-04 LAB — PROTEIN S ACTIVITY: Protein S Activity: 138 % (ref 63–140)

## 2017-09-05 LAB — CARDIOLIPIN ANTIBODIES, IGG, IGM, IGA
Anticardiolipin IgA: <9 APL U/mL (ref 0–11)
Anticardiolipin IgG: 19 GPL U/mL — ABNORMAL HIGH (ref 0–14)
Anticardiolipin IgM: <9 MPL U/mL (ref 0–12)

## 2017-10-01 ENCOUNTER — Other Ambulatory Visit: Payer: Self-pay | Admitting: Internal Medicine

## 2017-10-01 DIAGNOSIS — C61 Malignant neoplasm of prostate: Secondary | ICD-10-CM

## 2017-10-01 DIAGNOSIS — R200 Anesthesia of skin: Secondary | ICD-10-CM

## 2017-10-01 DIAGNOSIS — M5412 Radiculopathy, cervical region: Secondary | ICD-10-CM

## 2017-10-21 ENCOUNTER — Ambulatory Visit: Payer: BLUE CROSS/BLUE SHIELD

## 2017-10-30 ENCOUNTER — Other Ambulatory Visit: Payer: Self-pay | Admitting: *Deleted

## 2017-10-30 MED ORDER — RIVAROXABAN 20 MG PO TABS: 20.0000 mg | ORAL_TABLET | Freq: Every day | ORAL | 3 refills | Status: DC

## 2017-11-28 ENCOUNTER — Other Ambulatory Visit: Payer: Self-pay | Admitting: Orthopedic Surgery

## 2017-11-28 DIAGNOSIS — M7712 Lateral epicondylitis, left elbow: Secondary | ICD-10-CM

## 2017-11-28 DIAGNOSIS — M5030 Other cervical disc degeneration, unspecified cervical region: Secondary | ICD-10-CM

## 2017-11-28 DIAGNOSIS — M5412 Radiculopathy, cervical region: Secondary | ICD-10-CM

## 2017-12-05 ENCOUNTER — Inpatient Hospital Stay: Payer: BLUE CROSS/BLUE SHIELD | Attending: Hematology and Oncology

## 2017-12-05 ENCOUNTER — Ambulatory Visit: Payer: BLUE CROSS/BLUE SHIELD

## 2017-12-05 DIAGNOSIS — I2699 Other pulmonary embolism without acute cor pulmonale: Secondary | ICD-10-CM | POA: Diagnosis not present

## 2017-12-05 DIAGNOSIS — R760 Raised antibody titer: Secondary | ICD-10-CM

## 2017-12-06 LAB — LUPUS ANTICOAGULANT PANEL
DRVVT: 37.2 s (ref 0.0–47.0)
PTT Lupus Anticoagulant: 33.8 s (ref 0.0–51.9)

## 2017-12-10 ENCOUNTER — Inpatient Hospital Stay: Payer: BLUE CROSS/BLUE SHIELD | Attending: Hematology and Oncology | Admitting: Hematology and Oncology

## 2017-12-10 VITALS — BP 125/78 | HR 59 | Temp 98.2°F | Resp 18 | Wt 269.8 lb

## 2017-12-10 DIAGNOSIS — Z8546 Personal history of malignant neoplasm of prostate: Secondary | ICD-10-CM | POA: Insufficient documentation

## 2017-12-10 DIAGNOSIS — D6851 Activated protein C resistance: Secondary | ICD-10-CM

## 2017-12-10 DIAGNOSIS — Z7189 Other specified counseling: Secondary | ICD-10-CM | POA: Insufficient documentation

## 2017-12-10 DIAGNOSIS — Z7901 Long term (current) use of anticoagulants: Secondary | ICD-10-CM | POA: Diagnosis not present

## 2017-12-10 DIAGNOSIS — I2699 Other pulmonary embolism without acute cor pulmonale: Secondary | ICD-10-CM | POA: Insufficient documentation

## 2017-12-10 DIAGNOSIS — D6852 Prothrombin gene mutation: Secondary | ICD-10-CM | POA: Diagnosis not present

## 2017-12-10 MED ORDER — RIVAROXABAN 10 MG PO TABS: 10.0000 mg | ORAL_TABLET | Freq: Every day | ORAL | 0 refills | Status: DC

## 2017-12-10 NOTE — Progress Notes (Signed)
[facility] day:  12/10/2017  Chief Complaint: Rick Rosario is a 62 y.o. male with a pulmonary embolism who is seen for 3 month assessment.  HPI: The patient was last seen in the medical oncology clinic on 09/03/2017.  At that time, he felt "alright".  Exam was stable.  He continued Xarelto.  Labs on 09/03/2017 revealed a hematocrit of 45.2, hemoglobin 15.9, MCV 90.7, platelets 224,000, WBC 6100 with an ANC of 3700. Creatinine was 1.14.  LFTs were normal.  Protein C activity was 104%.  Protein C total was 81%.  Protein S activity was 138%.  Protein S antigen total was 85% and protein S antigen free was 130%.  ATIII activity was 99%.  Anticardiolipin IgG was 19 (intermediate)  Lupus anticoagulant panel was normal on 12/05/2017.  During the interim, patient is having "neck problems". 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 He states, "It feels like they gave me Novocaine in my hand at times". He is schedule for MRI imaging later this week to further assess. Pain in the neck is rated 5/10 today.   Patient denies chest pain, shortness of breath, extremity swelling, and claudication pain.  He has experienced no B symptoms or interval infections.  Patient is eating well. His weight is down 2 pounds.    Past Medical History:  Diagnosis Date  . Anxiety   . Arthritis   . Cancer [facility])    Prostate  . Depression   . Prostate cancer [facility])     Past Surgical History:  Procedure Laterality Date  . EYE SURGERY Left    Cataract Extraction with IOL  . RADIOACTIVE SEED IMPLANT N/A 07/09/2016   Procedure: RADIOACTIVE SEED IMPLANT/BRACHYTHERAPY IMPLANT;  Surgeon: [name], MD;  Location: [facility] ORS;  Service: Urology;  Laterality: N/A;  . RETINAL DETACHMENT SURGERY Left    [place]     Age 59 years old  . VARICOSE VEIN SURGERY Left   . VOLUME STUDY  06/11/2016   [facility]    Family History   Problem Relation Age of Onset  . Cancer Mother   . Heart attack Father   . Cancer Maternal Aunt   . Cancer Maternal Uncle   . Hematuria Neg Hx   . Renal cancer Neg Hx   . Prostate cancer Neg Hx   . Sickle cell anemia Neg Hx     Social History:  reports that he has never smoked. He has never used smokeless tobacco. He reports that he does not drink alcohol or use drugs.  Patient has never used tobacco. He seldom uses alcohol. Patient is employed full time as a Nature conservation officer. The patient is accompanied by his wife today.  Allergies:  Allergies  Allergen Reactions  . Penicillin G Rash    Caused rash as a child Has patient had a PCN reaction causing immediate rash, facial/tongue/throat swelling, SOB or lightheadedness with hypotension:unsure Has patient had a PCN reaction causing severe rash involving mucus membranes or skin necrosis:unsure Has patient had a PCN reaction that required hospitalization:NO Has patient had a PCN reaction occurring within the last 10 years:No If all of the above answers are "NO", then may proceed with Cephalosporin use.     Current Medications: Current Outpatient Medications  Medication Sig Dispense Refill  . aspirin EC 81 MG tablet Take 81 mg by mouth daily.     . celecoxib (CELEBREX) 200 MG capsule Take 200 mg by  mouth daily.  1  . diazepam (VALIUM) 10 MG tablet Take 10 mg by mouth.  0  . gabapentin (NEURONTIN) 300 MG capsule Take 300 mg by mouth 2 (two) times daily.    . Garlic 2130 MG CAPS Take 1,000 mg by mouth daily.     . Multiple Vitamin (MULTIVITAMIN WITH MINERALS) TABS tablet Take 1 tablet by mouth daily.    . naproxen sodium (ANAPROX) 220 MG tablet Take 220 mg by mouth daily as needed (for pain/headache.).     [name] PARoxetine (PAXIL) 20 MG tablet Take 10 mg by mouth daily.     . rivaroxaban (XARELTO) 20 MG TABS tablet Take 1 tablet (20 mg total) by mouth daily with supper. 30 tablet 3  . tetrahydrozoline-zinc (VISINE-AC) 0.05-0.25 %  ophthalmic solution 1-2 drops 3 (three) times daily as needed (for allergy eyes).     No current facility-administered medications for this visit.     Review of Systems:  GENERAL:  Feels good.  Active.  No fevers or sweats. Weight fluctuates.  Weight down 2 pounds.  PERFORMANCE STATUS (ECOG):  0 HEENT:  No visual changes, runny nose, sore throat, mouth sores or tenderness. Lungs: No shortness of breath or cough.  No hemoptysis. Cardiac:  No chest pain, palpitations, orthopnea, or PND. GI:  No nausea, vomiting, diarrhea, constipation, melena or hematochezia. GU:  No urgency, frequency, dysuria, or hematuria. Musculoskeletal:  Cervical radiculopathy with LUE myelopathy. No back pain.  No joint pain.  No muscle tenderness. Extremities:  Numbness in LUE related to cervical disc issue. No pain or swelling. Skin:  No rashes or skin changes. Neuro:  No headache, numbness or weakness, balance or coordination issues. Endocrine:  No diabetes, thyroid issues, hot flashes or night sweats. Psych:  No mood changes, depression or anxiety. Pain:  5/10 - neck and LUE. Review of systems:  All other systems reviewed and found to be negative.   Physical Exam: Blood pressure 125/78, pulse (!) 59, temperature 98.2 F (36.8 C), temperature source Tympanic, resp. rate 18, weight 269 lb 13.5 oz (122.4 kg). GENERAL:  Well developed, well nourished, gentleman sitting comfortably in the exam room in no acute distress. MENTAL STATUS:  Alert and oriented to person, place and time. HEAD:  Pearline Cables hair and beard.  Normocephalic, atraumatic, face symmetric, no Cushingoid features. EYES:  RIGHT ptosis. Ocular implant in LEFT eye.  Pupils reactive to light.  No conjunctivitis or scleral icterus. ENT:  Oropharynx clear without lesion.  Tongue normal. Mucous membranes moist.  RESPIRATORY:  Clear to auscultation without rales, wheezes or rhonchi. CARDIOVASCULAR:  Regular rate and rhythm without murmur, rub or  gallop. ABDOMEN:  Soft, non-tender, with active bowel sounds, and no hepatosplenomegaly.  No masses. SKIN:  No rashes, ulcers or lesions. 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 No edema, no skin discoloration or tenderness.  No palpable cords. LYMPH NODES: No palpable cervical, supraclavicular, axillary or inguinal adenopathy  NEUROLOGICAL: Unremarkable. PSYCH:  Appropriate.    No visits with results within 3 Day(s) from this visit.  Latest known visit with results is:  Appointment on 12/05/2017  Component Date Value Ref Range Status  . PTT Lupus Anticoagulant 12/05/2017 33.8  0.0 - 51.9 sec Final  . DRVVT 12/05/2017 37.2  0.0 - 47.0 sec Final  . Lupus Anticoag Interp 12/05/2017 Comment:   Corrected   Comment: (NOTE) No lupus anticoagulant was detected. Performed At: [facility] [place], [place] [id number] Rush Farmer MD EX:[id number] Performed at [facility], [address], [place], [place] [address]     Assessment:  Rick Rosario is a 62 y.o. male with right sided pulmonary emboli.  He presented with chest pain.  There is no family history of thrombosis.  He is on Xarelto.  Chest CT angiogram on 06/08/2017 revealed right middle and right lower lobe segmental/subsegmental pulmonary artery emboli. There was no evidence of right heart strain. There was an ovoid lesion in the tail of the pancreas, incompletely characterized. Further evaluation with MRI of the abdomen with and without contrast was recommended.   MRI abdomen on 06/19/2017 revealed no evidence of pancreatic mass or other significant abnormality.  Bilateral lower extremity duplex on 06/12/2017 revealed no evidence of DVT.  Work-up on 06/11/2017 revealed the following normal labs:  Factor V Leiden, prothrombin gene mutation, and beta2-glycoprotein.  Lupus anticoagulant testing was positive on Xarelto.  Anticardiolipin IgG was 19 (indeterminate).  CA19-1 was 6  (normal).  Work-up on 09/03/2017 revealed the following normal labs: protein C activity/antigen, protein S activity/antigen, and ATIII activity.  Anticardiolipin IgG was 19 (intermediate).  Lupus anticoagulant testing was negative on 12/05/2017.  He does not have a lupus anticoagulant.  He has a history of prostate cancer s/p transrectal ultrasound-guided biopsy on 05/07/2016.  Three of 12 core biopsies were positive for adenocarcinoma, Gleason 7 (3+4). He underwent I-125 interstitial implant on 07/09/2016.  PSA was 0.89 on 11/28/2016 and 0.71 on 06/04/2017.  Symptomatically, he feels "ok".  He notes a cervical radiculopathy.  Imaging is scheduled.  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  Plan: 1.  Review labs from 09/03/2017 and 12/05/2017.  No protein C, S, ATIII deficiency.  No lupus anticoagulant. 2.  Discuss consideration of genetic testing. Patient has a strong family history of cancer. Patient to call clinic if interested. 3.  Discuss risk of recurrent thrombosis in unprovoked thrombosis is approximately 10% at year 1 post discontinuation of anticoagulation and 30% at 5 years (5%/year after first year).  Prior duration of anticoagulation does not affect recurrence.  Risk of bleeding estimated at approximately 0.8%/year.  Discuss study comparing full dose Xarelto (20 mg/day) versus prophylactic dose Xarelto 10 mg/day) after 6-12 months of full dose anticoagulation.  Similar rates of thrombosis (1.2-1.5%) were noted compared to aspirin (4.4%).  Major bleeding was low (0.3 - 0.5%) across arms.  We discussed continuation of Xarelto. Discussed plan to decrease dose to 10 mg daily.  Discuss plan to hold Xarelto if surgery needed. 4.  Discuss MRI of neck. Patient likely facing cervical spine surgery. Will need 2 day pre-procedure washout.  5.  RTC in 6 months for MD assessment and labs (CBC with diff, CMP, PSA).   [name], NP  12/10/2017, 3:24 PM   I saw and evaluated the patient, participating in  the key portions of the service and reviewing pertinent diagnostic studies and records.  I reviewed the nurse practitioner's note and agree with the findings and the plan.  The assessment and plan were discussed with the patient. Multiple questions were asked by the patient and answered.   Nolon Stalls, MD 12/10/2017,3:24 PM

## 2017-12-10 NOTE — Progress Notes (Signed)
Patient states he has a disc in his neck that is pinching a nerve which is involving his left shoulder and arm.  He tried to have an MRI yesterday but was unable to because he could not lie flat.  It has been rescheduled for this Friday.  Otherwise, patient is doing well and offers no further complaints.

## 2017-12-13 ENCOUNTER — Encounter: Payer: Self-pay | Admitting: Hematology and Oncology

## 2017-12-18 ENCOUNTER — Telehealth: Payer: Self-pay | Admitting: *Deleted

## 2017-12-18 NOTE — Telephone Encounter (Signed)
Ok to hold rivaroxaban for 48 hours prior to planned cervical steroid injection. [place], [place] will need to advise patient on restarting medication based on the procedure. If there are complications, he may elect to hold post procedurally.   Patient will need to be notified by [facility] office of when he needs stop his anticoagulation, as I am not aware of when they are planning on doing this.   [name], MSN, APRN, FNP-C, CEN Oncology/Hematology Nurse Practitioner  [facility] 12/18/17, 5:03 PM

## 2017-12-18 NOTE — Telephone Encounter (Signed)
Rick Rosario needs approval to stop Xarelto fro 48 hrs prior to steroid neck injection . Please advise and notify patient

## 2017-12-19 ENCOUNTER — Telehealth: Payer: Self-pay | Admitting: *Deleted

## 2017-12-19 NOTE — Telephone Encounter (Signed)
Patient called and stated that he needs written permission from Dr. Mike Gip, to stop his Xarelto for 48 hours, so he can have a procedure. He is in a lot of pain and needs to have this done asap. Please call him at [phone].    dhs

## 2017-12-19 NOTE — Telephone Encounter (Signed)
Dr. Mike Gip has returned a call to the patient. She has asked that he return a call to her to discuss. LMOM. Originally, I told Hassan Rowan 2 days, but Mike Gip wants to discuss the procedure more and potentially extend the hold period beyond 2 days.

## 2017-12-22 ENCOUNTER — Telehealth: Payer: Self-pay | Admitting: *Deleted

## 2017-12-22 NOTE — Telephone Encounter (Signed)
Patient left a voice mail message on the triage line;  he is upset because Dr. Mike Gip did not call the appropriate provider regarding him stopping his Xarelto in time for him to get his procedure done this week.  Call returned to patient; patient was informed that there is a note in the chart stating that [name], NP called today and spoke with Caryl Pina at the number provided by the patient, and after determining needs for clearance of patient's planned procedure of cervical spine injections, faxed over orders regarding stopping the patient's Xarelto for 3 days prior. Patient stated, "Well, now I can't get my procedure done this week, because I have to go out of town on Friday, and I don't know why Dr. Mike Gip didn't handle this last week when I first called about it."   Apologized to patient for the delay.      dhs

## 2017-12-22 NOTE — Telephone Encounter (Signed)
Call returned to number provided. I spoke with Rick Rosario to determine needs for clearance of patient's planned procedure. This has been discussed at length with Dr. Mike Gip. Faxed order sent to [facility] physiatry as follows:  HOLD rivaroxaban x 3 days prior to the planned cervical spine injections. Based on the course of the procedure, performing provider should recommended when patient should restart his anticoagulation therapy.  Diagnosis: I26.99, R76.0, Z97.01  Rick Rosario asked to return call to the clinic wuth an questions or further needs regarding this patient.   [name], MSN, APRN, FNP-C, CEN Oncology/Hematology Nurse Practitioner  [facility] 12/22/17, 4:24 PM

## 2017-12-22 NOTE — Telephone Encounter (Signed)
Patient is scheduled to have an injection in his neck and needs Dr. Renford Dills to notify provider that it is OK to hold XARELTO for two days. He did not have provider name but did have phone [phone].

## 2017-12-24 ENCOUNTER — Other Ambulatory Visit: Payer: Self-pay | Admitting: Orthopedic Surgery

## 2017-12-24 ENCOUNTER — Telehealth: Payer: Self-pay | Admitting: *Deleted

## 2017-12-24 DIAGNOSIS — M5412 Radiculopathy, cervical region: Secondary | ICD-10-CM

## 2017-12-24 NOTE — Telephone Encounter (Signed)
lled and states that his injection has now been rescheduled to [facility] and patient needs to speak with someone about this and his Xarelto. I returned call to patient and he states he has already spoken to [facility] office where Dr Mike Gip is working today and it is taken care of

## 2017-12-24 NOTE — Telephone Encounter (Signed)
Message faxed to [facility] office

## 2017-12-31 ENCOUNTER — Ambulatory Visit
Admission: RE | Admit: 2017-12-31 | Discharge: 2017-12-31 | Disposition: A | Discharge status: Home / Self Care | Payer: BLUE CROSS/BLUE SHIELD | Source: Ambulatory Visit | Attending: Orthopedic Surgery | Admitting: Orthopedic Surgery

## 2017-12-31 DIAGNOSIS — M5412 Radiculopathy, cervical region: Secondary | ICD-10-CM

## 2017-12-31 MED ORDER — TRIAMCINOLONE ACETONIDE 40 MG/ML IJ SUSP (RADIOLOGY)
60.0000 mg | Freq: Once | INTRAMUSCULAR | Status: AC
  Administered 2017-12-31: 60 mg via EPIDURAL

## 2017-12-31 MED ORDER — IOPAMIDOL (ISOVUE-M 300) INJECTION 61%
1.0000 mL | Freq: Once | INTRAMUSCULAR | Status: AC | PRN
  Administered 2017-12-31: 1 mL via EPIDURAL

## 2017-12-31 NOTE — Discharge Instructions (Signed)

## 2018-01-24 ENCOUNTER — Other Ambulatory Visit: Payer: Self-pay | Admitting: Urgent Care

## 2018-01-25 NOTE — Telephone Encounter (Signed)
Weren't your plans to decrease to 10 mg daily?

## 2018-04-29 ENCOUNTER — Other Ambulatory Visit: Payer: Self-pay | Admitting: Urgent Care

## 2018-06-24 ENCOUNTER — Encounter: Payer: Self-pay | Admitting: Radiation Oncology

## 2018-06-24 ENCOUNTER — Other Ambulatory Visit: Payer: Self-pay

## 2018-06-24 ENCOUNTER — Inpatient Hospital Stay: Payer: BLUE CROSS/BLUE SHIELD | Attending: Radiation Oncology

## 2018-06-24 ENCOUNTER — Ambulatory Visit
Admission: RE | Admit: 2018-06-24 | Discharge: 2018-06-24 | Disposition: A | Discharge status: Home / Self Care | Payer: BLUE CROSS/BLUE SHIELD | Source: Ambulatory Visit | Attending: Radiation Oncology | Admitting: Radiation Oncology

## 2018-06-24 ENCOUNTER — Other Ambulatory Visit: Payer: Self-pay | Admitting: *Deleted

## 2018-06-24 DIAGNOSIS — Z7901 Long term (current) use of anticoagulants: Secondary | ICD-10-CM | POA: Diagnosis present

## 2018-06-24 DIAGNOSIS — I2699 Other pulmonary embolism without acute cor pulmonale: Secondary | ICD-10-CM | POA: Diagnosis present

## 2018-06-24 DIAGNOSIS — Z923 Personal history of irradiation: Secondary | ICD-10-CM | POA: Diagnosis not present

## 2018-06-24 DIAGNOSIS — C61 Malignant neoplasm of prostate: Secondary | ICD-10-CM | POA: Diagnosis not present

## 2018-06-24 LAB — COMPREHENSIVE METABOLIC PANEL
ALT: 22 U/L (ref 0–44)
AST: 30 U/L (ref 15–41)
Albumin: 3.9 g/dL (ref 3.5–5.0)
Alkaline Phosphatase: 52 U/L (ref 38–126)
Anion gap: 6 (ref 5–15)
BUN: 20 mg/dL (ref 8–23)
CO2: 27 mmol/L (ref 22–32)
Calcium: 9.3 mg/dL (ref 8.9–10.3)
Chloride: 104 mmol/L (ref 98–111)
Creatinine, Ser: 1.11 mg/dL (ref 0.61–1.24)
GFR calc Af Amer: >60 mL/min (ref >60)
GFR calc non Af Amer: >60 mL/min (ref >60)
Glucose, Bld: 112 mg/dL — ABNORMAL HIGH (ref 70–99)
Potassium: 4.8 mmol/L (ref 3.5–5.1)
Sodium: 137 mmol/L (ref 135–145)
Total Bilirubin: 0.8 mg/dL (ref 0.3–1.2)
Total Protein: 7.4 g/dL (ref 6.5–8.1)

## 2018-06-24 LAB — CBC WITH DIFFERENTIAL/PLATELET
Abs Immature Granulocytes: 0.01 10*3/uL (ref 0.00–0.07)
Basophils Absolute: 0 10*3/uL (ref 0.0–0.1)
Basophils Relative: 1 %
Eosinophils Absolute: 0.2 10*3/uL (ref 0.0–0.5)
Eosinophils Relative: 3 %
HCT: 42.7 % (ref 39.0–52.0)
Hemoglobin: 14.4 g/dL (ref 13.0–17.0)
Immature Granulocytes: 0 %
Lymphocytes Relative: 41 %
Lymphs Abs: 2.3 10*3/uL (ref 0.7–4.0)
MCH: 30.7 pg (ref 26.0–34.0)
MCHC: 33.7 g/dL (ref 30.0–36.0)
MCV: 91 fL (ref 80.0–100.0)
Monocytes Absolute: 0.5 10*3/uL (ref 0.1–1.0)
Monocytes Relative: 8 %
Neutro Abs: 2.6 10*3/uL (ref 1.7–7.7)
Neutrophils Relative %: 47 %
Platelets: 180 10*3/uL (ref 150–400)
RBC: 4.69 MIL/uL (ref 4.22–5.81)
RDW: 12.7 % (ref 11.5–15.5)
WBC: 5.5 10*3/uL (ref 4.0–10.5)
nRBC: 0 % (ref 0.0–0.2)

## 2018-06-24 LAB — PSA: Prostatic Specific Antigen: 0.14 ng/mL (ref 0.00–4.00)

## 2018-06-24 NOTE — Progress Notes (Signed)
Radiation Oncology Follow up Note  Name: Rick Rosario   Date:   06/24/2018 MRN:  [id number] DOB: April 30, 1956    This 62 y.o. 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  REFERRING PROVIDER: Ezequiel Kayser, MD  HPI: patient is a 62 year old male now out 2 years having completed I-125 interstitial implant for Gleason 7 (3+4) presenting the PSA of 5. Seen today in routine follow-up he is doing well. He specifically denies diarrhea dysuria or any other GI/GU complaints. He had his PSA drawn today and is pending..his last PSA 1 year prior was 3.78  COMPLICATIONS OF TREATMENT: none  FOLLOW UP COMPLIANCE: keeps appointments   PHYSICAL EXAM:  BP (!) (P) 129/56 (BP Location: Left Arm, Patient Position: Sitting)   Pulse (!) (P) 51   Temp (!) (P) 97.4 F (36.3 C) (Tympanic)   Wt (P) 247 lb 2.2 oz (112.1 kg)   BMI (P) 30.89 kg/m  On rectal exam rectal sphincter tone is good. Prostate is smooth contracted without evidence of nodularity or mass. Sulcus is preserved bilaterally. No discrete nodularity is identified. No other rectal abnormalities are noted.Well-developed well-nourished patient in NAD. HEENT reveals PERLA, EOMI, discs not visualized.  Oral cavity is clear. No oral mucosal lesions are identified. Neck is clear without evidence of cervical or supraclavicular adenopathy. Lungs are clear to A&P. Cardiac examination is essentially unremarkable with regular rate and rhythm without murmur rub or thrill. Abdomen is benign with no organomegaly or masses noted. Motor sensory and DTR levels are equal and symmetric in the upper and lower extremities. Cranial nerves II through XII are grossly intact. Proprioception is intact. No peripheral adenopathy or edema is identified. No motor or sensory levels are noted. Crude visual fields are within normal range.  RADIOLOGY RESULTS: no current films for review  PLAN: the  present time patient is doing well we'll follow-up on his PSA in the next day or 2. His last PSA was 0.71 showing excellent biochemical control of his prostate cancer. I have set up follow-up in one year with repeat PSA at that time. Patient is to call sooner with any concerns. I would like to take this opportunity to thank you for allowing me to participate in the care of your patient.Noreene Filbert, MD

## 2019-06-25 ENCOUNTER — Inpatient Hospital Stay: Payer: Self-pay | Attending: Hematology and Oncology

## 2019-07-02 ENCOUNTER — Ambulatory Visit: Payer: Self-pay | Attending: Radiation Oncology | Admitting: Radiation Oncology

## 2019-07-02 ENCOUNTER — Inpatient Hospital Stay: Payer: Self-pay

## 2022-06-24 NOTE — ED Notes (Signed)
Pulled patient back to room 9 - Patietn reports that he was picking up nails and someone dropped a hammer on his head. Patient reports the metal part of the hammer hit him in the head. Patient does have a laceration on the top of his head. Bleeding is controled at this time. Patient reports he is on no blood thinners but he does take aspirin '81mg'$  daily.   Made the providers aware of the above and that we do not have CT and patient was advised to go to the ED due to needing imaging.   Patient reports he will be going to [facility] [place].
